# Patient Record
Sex: Female | Born: 1965 | Race: White | Hispanic: No | Marital: Married | Smoking: Former smoker
Health system: Southern US, Community
[De-identification: ages and names within clinical notes are randomized; demographics above are authoritative.]

## PROBLEM LIST (undated history)

## (undated) DIAGNOSIS — E559 Vitamin D deficiency, unspecified: Secondary | ICD-10-CM

## (undated) HISTORY — PX: SPINE SURGERY: SHX786

## (undated) HISTORY — DX: Vitamin D deficiency, unspecified: E55.9

## (undated) HISTORY — PX: KNEE ARTHROSCOPY: SUR90

## (undated) HISTORY — PX: ABDOMINAL HYSTERECTOMY: SHX81

---

## 1998-05-28 ENCOUNTER — Other Ambulatory Visit: Admission: RE | Admit: 1998-05-28 | Discharge: 1998-05-28 | Payer: Self-pay | Admitting: *Deleted

## 1998-10-21 ENCOUNTER — Ambulatory Visit (HOSPITAL_COMMUNITY): Admission: RE | Admit: 1998-10-21 | Discharge: 1998-10-21 | Payer: Self-pay | Admitting: *Deleted

## 1999-04-23 ENCOUNTER — Emergency Department (HOSPITAL_COMMUNITY): Admission: EM | Admit: 1999-04-23 | Discharge: 1999-04-23 | Payer: Self-pay | Admitting: Emergency Medicine

## 1999-04-23 ENCOUNTER — Encounter: Payer: Self-pay | Admitting: Emergency Medicine

## 1999-06-08 ENCOUNTER — Other Ambulatory Visit: Admission: RE | Admit: 1999-06-08 | Discharge: 1999-06-08 | Payer: Self-pay | Admitting: *Deleted

## 2000-07-13 ENCOUNTER — Other Ambulatory Visit: Admission: RE | Admit: 2000-07-13 | Discharge: 2000-07-13 | Payer: Self-pay | Admitting: *Deleted

## 2001-07-25 ENCOUNTER — Other Ambulatory Visit: Admission: RE | Admit: 2001-07-25 | Discharge: 2001-07-25 | Payer: Self-pay | Admitting: *Deleted

## 2002-07-29 ENCOUNTER — Other Ambulatory Visit: Admission: RE | Admit: 2002-07-29 | Discharge: 2002-07-29 | Payer: Self-pay | Admitting: *Deleted

## 2003-09-03 ENCOUNTER — Other Ambulatory Visit: Admission: RE | Admit: 2003-09-03 | Discharge: 2003-09-03 | Payer: Self-pay | Admitting: *Deleted

## 2003-11-18 ENCOUNTER — Encounter: Admission: RE | Admit: 2003-11-18 | Discharge: 2003-11-18 | Payer: Self-pay | Admitting: Specialist

## 2003-11-20 ENCOUNTER — Encounter: Admission: RE | Admit: 2003-11-20 | Discharge: 2003-11-20 | Payer: Self-pay | Admitting: Specialist

## 2003-12-23 ENCOUNTER — Observation Stay (HOSPITAL_COMMUNITY): Admission: RE | Admit: 2003-12-23 | Discharge: 2003-12-24 | Payer: Self-pay | Admitting: Specialist

## 2003-12-23 ENCOUNTER — Encounter (INDEPENDENT_AMBULATORY_CARE_PROVIDER_SITE_OTHER): Payer: Self-pay | Admitting: Specialist

## 2004-09-26 ENCOUNTER — Other Ambulatory Visit: Admission: RE | Admit: 2004-09-26 | Discharge: 2004-09-26 | Payer: Self-pay | Admitting: Obstetrics and Gynecology

## 2005-02-01 ENCOUNTER — Encounter: Admission: RE | Admit: 2005-02-01 | Discharge: 2005-02-01 | Payer: Self-pay | Admitting: Specialist

## 2006-01-31 ENCOUNTER — Encounter: Admission: RE | Admit: 2006-01-31 | Discharge: 2006-01-31 | Payer: Self-pay | Admitting: Specialist

## 2006-04-04 IMAGING — CT CT L SPINE W/ CM
3 of 8 series · 12 of 33 positions shown, 14 images · IV contrast (omnipaque)
Comparison: none

CLINICAL DATA: Low back pain radiating to the left leg.  MRI shows an abnormal disk at L5-S1.
 LUMBAR MYELOGRAM
 A lumbar puncture was performed using a 22 gauge spinal needle from a left sided approach at L4-5.  12 cc of Omnipaque 180 were instilled.

[Series 4: recon 3: l-spine helical · axial · 0.27mm/px · z∈[-28,+51]mm · 4 of 105 slices shown, 5 images]
[im 21/105  soft-tissue]
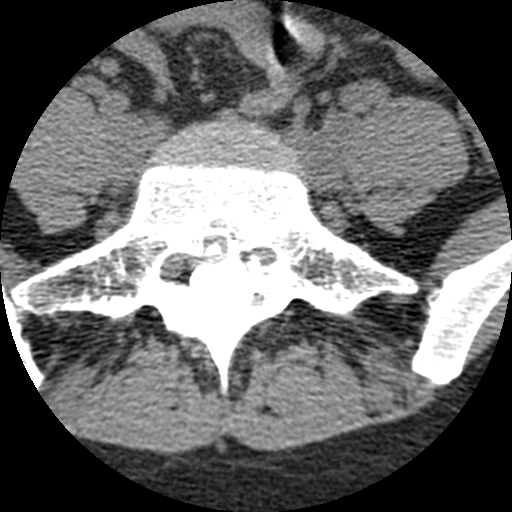
[im 21/105  bone]
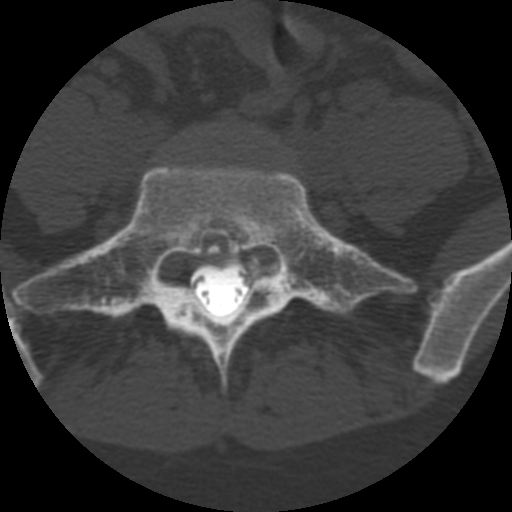
[im 42/105  bone]
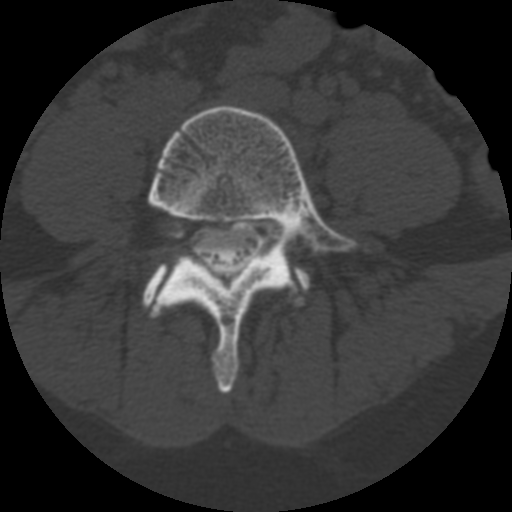
[im 63/105  bone]
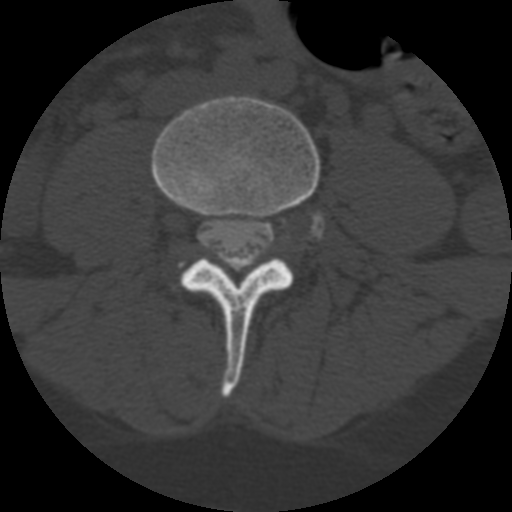
[im 84/105  bone]
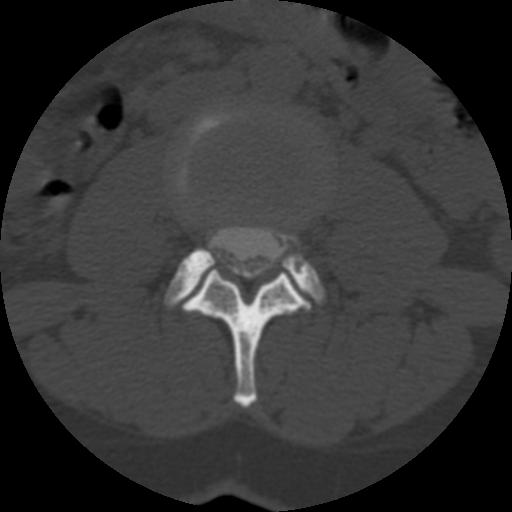

[Series 400: reformatted · sagittal · 0.27mm/px · 3 of 40 slices shown (1 of 2)]
[im 8/40  bone]
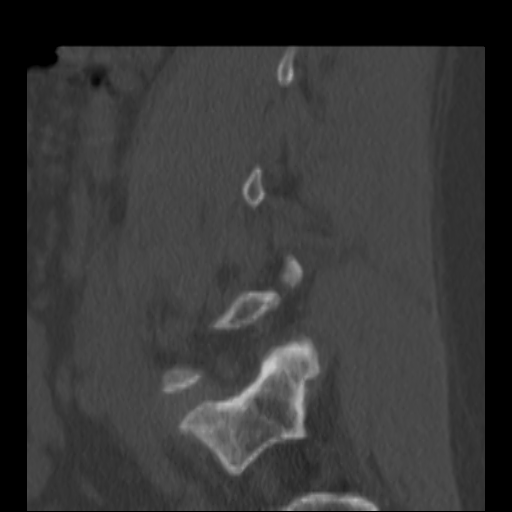
[im 16/40  bone]
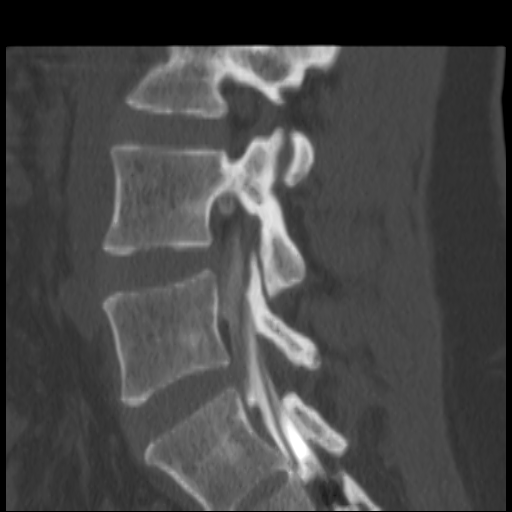
[im 24/40  bone]
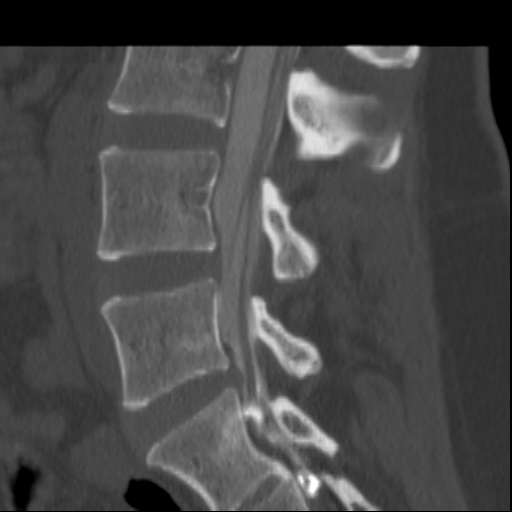

[Series 401: reformatted · coronal · 0.27mm/px · 5 of 40 slices shown, 6 images (2 of 2)]
[im 14/40  bone]
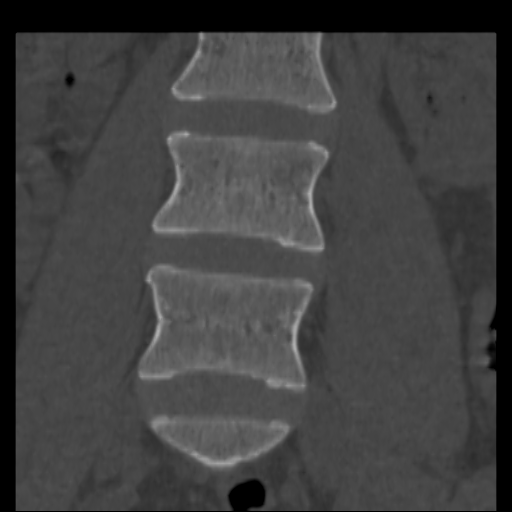
[im 17/40  bone]
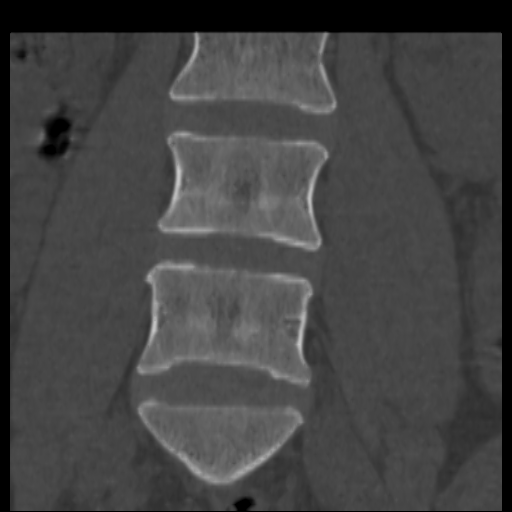
[im 20/40  soft-tissue]
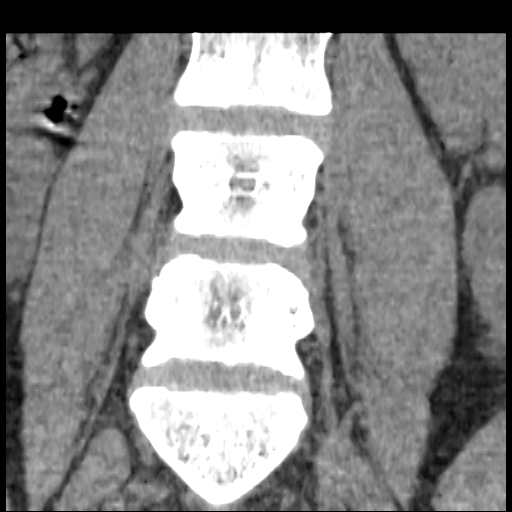
[im 20/40  bone]
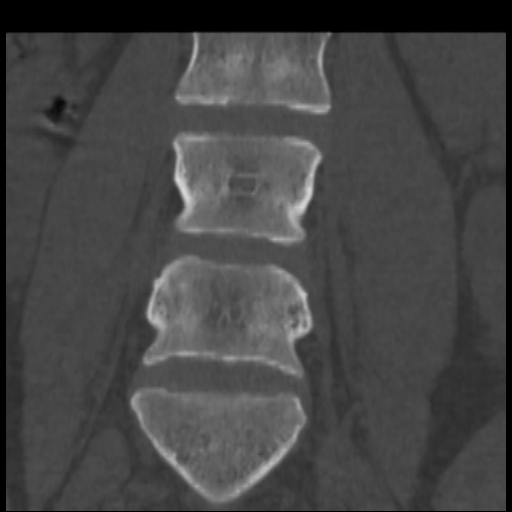
[im 23/40  bone]
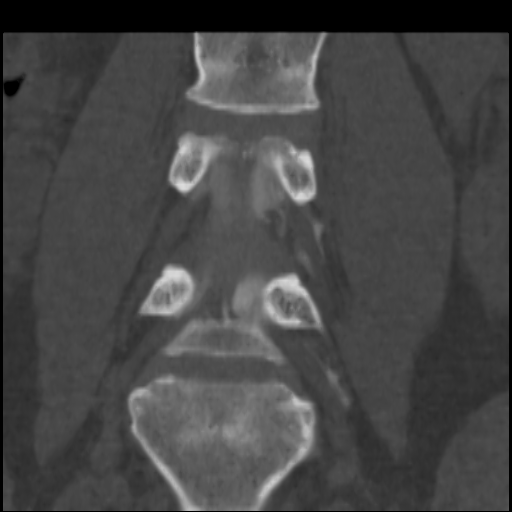
[im 27/40  bone]
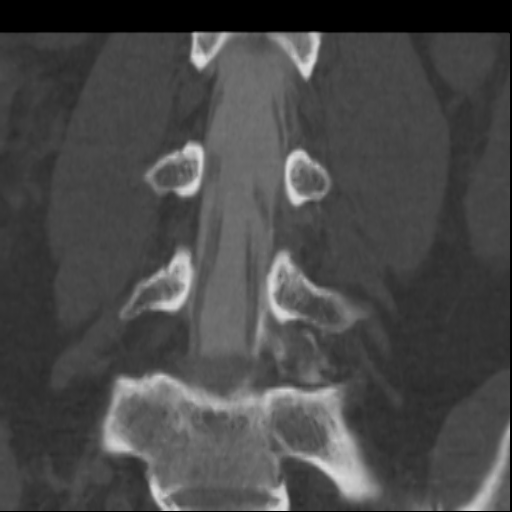

[12 of 33 positions shown; findings below may reference images not displayed]

The study shows a small anterior extradural defect at L5-S1.  There is no demonstrable compression of any root sleeve.  With standing flexion and extension, the anterior extradural defect becomes less apparent with flexion and more apparent with extension.  Right and left lateral bending does not show any convincing compressive pathology.  It is possible there is some thinning of the contrast in the left lateral recess.  
 IMPRESSION
 Small anterior extradural defect at L5-S1 more prominent in the neutral and extension positions than in the flexion position.  No definite compressive stenosis.  I question whether there could be mild thinning of the contrast column in the left lateral recess with standing and bending films.

 POST MYELOGRAM CT SCAN OF THE LUMBAR SPINE 
 Spiral scanning was performed from L3 to S1.  

 L3-4:  Normal interspace. 

 L4-5:  Minimal disk bulge, but no herniation, stenosis or foraminal compromise. 

 L5-S1:  Shallow broad-based disk protrusion perhaps minimally more prominent towards the left.  Slight caudal down-turning of disk material behind the superior endplate of S1.  No apparent compressive affect upon the thecal sac.  The disk abuts the S1 nerve root as it buds from thecal sac and it is conceivable that this nerve could be irritated.   Certainly it is not grossly compressed as seen in this position. 

 No evidence of pars defect or slippage. 
 IMPRESSION
 Shallow broad-based disk protrusion at L5-S1 slightly eccentrically  prominent towards the left and with slight caudal down-turning behind the superior endplate of S1.  Although the thecal sac or nerve roots are not grossly compressed, it is possible that there could be nerve root irritation, particularly with respect to the left S1 nerve root. 

 CT MULTIPLANAR REFORMATION
 Sagittal, coronal and para-axial  reformations were done which aid in depiction of the above described findings.

## 2006-05-17 ENCOUNTER — Ambulatory Visit (HOSPITAL_COMMUNITY): Admission: RE | Admit: 2006-05-17 | Discharge: 2006-05-18 | Payer: Self-pay | Admitting: Obstetrics and Gynecology

## 2006-05-17 ENCOUNTER — Encounter (INDEPENDENT_AMBULATORY_CARE_PROVIDER_SITE_OTHER): Payer: Self-pay | Admitting: Specialist

## 2007-04-01 ENCOUNTER — Encounter: Admission: RE | Admit: 2007-04-01 | Discharge: 2007-04-01 | Payer: Self-pay | Admitting: Obstetrics and Gynecology

## 2010-10-07 NOTE — Op Note (Signed)
Deanna, Barrett                  ACCOUNT NO.:  [id number]   MEDICAL RECORD NO.:  [id number]          PATIENT TYPE:  AMB   LOCATION:  SDC                           FACILITY:  [facility]   PHYSICIAN:  Dineen Kid. Rana Snare, M.D.    DATE OF BIRTH:  Jul 15, 1965   DATE OF PROCEDURE:  05/17/2006  DATE OF DISCHARGE:                               OPERATIVE REPORT   PREOPERATIVE DIAGNOSES:  1. Menometrorrhagia.  2. Abnormal uterine bleeding.  3. Pelvic pain.  4. Dyspareunia.  5. Fibroids.   POSTOPERATIVE DIAGNOSES:  1. Menometrorrhagia.  2. Abnormal uterine bleeding.  3. Pelvic pain.  4. Dyspareunia.  5. Fibroids.  6. Pelvic adhesions.   PROCEDURE:  Laparoscopically assisted vaginal hysterectomy and lysis of  adhesions.   SURGEON:  Dr. Candice Camp.   ASSISTANT:  Dr. Marcelle Overlie.   ANESTHESIA:  General endotracheal.   INDICATIONS:  Deanna Barrett is a 45 year old G2, P2 with worsening pelvic  pain, dyspareunia, abnormal uterine bleeding, normal saline infusion  ultrasound of fibroids and adenomyosis.  This has not been relieved with  conservative medical therapy. She desires definitive surgical  intervention and requests hysterectomy. She has had a tubal ligation in  the past, so childbearing is not an issue. Risks and benefits of the  procedure were discussed in length. Informed consent was obtained.   FINDINGS AT THE TIME OF SURGERY:  Boggy, slightly enlarged uterus with  several small fibroids. There was also adhesion from the right fallopian  tube to the cecum. Normal appearing liver. Normal appearing ovaries.   DESCRIPTION OF PROCEDURE:  After adequate anesthesia, the patient was  placed in the dorsal lithotomy position. She was sterilely prepped and  draped. Bladder sterilely drained. Grave's speculum was placed,  tenaculum was placed on the cervix. A 1-cm infraumbilical skin incision  was then made. Veress needle was inserted.  The abdomen was insufflated  with dullness to  percussion. A 11-mm trocar was inserted. The above  findings were noted by laparoscope. A 5-mm trocar was inserted left of  the midline, two fingerbreadths above the pubic symphysis under direct  visualization.  A gyrus grasping forceps was used to grasp the adhesion  from the appendix to the fallopian tube was coagulated and dissected  free. The left utero-ovarian ligament was identified, ligated and  dissected down across the utero-ovarian ligament, fallopian tube,  mesosalpinx, down across the round ligament. Good hemostasis achieved.  On reexamination, the left tube and ovarian appeared to have good  hemostasis. Care was taken to avoid the bowel and the underlying ureter.  The right utero-ovarian ligament was identified, ligated and dissected  free from the uterus, down across the mesosalpinx and the round ligament  with care taken to avoid the underlying bowel and ureter. Good  hemostasis was achieved. Bladder flap was elevated. Small flap was  created underneath the bladder at the utero-vesical junction. The  abdomen was then desufflated, the legs repositioned.  A weighted  speculum was placed in the vagina. Posterior colpotomy was performed.  The cervix was circumscribed with Bovie cautery. A LigaSure instrument  was used to ligate across the uterosacral ligaments bilaterally,  dissected with Mayo scissors. The cardinal ligaments were ligated and  dissected. Bladder pillars were dissected in a similar fashion. The  anterior vaginal mucosa was dissected off of the anterior portion of the  cervix. The anterior peritoneum was entered sharply. A Deaver retractor  was placed underneath the bladder. The inferior portions of the broad  ligament were then ligated with the LigaSure, dissected with Mayo  scissors. The uterus was removed. Uterosacral ligaments were identified.  Suture of 0 Monocryl was used to ligate each ligament, plicated in the  midline. The vagina was then closed using  interrupted figure-of-eights  of 0 Monocryl suture with good approximation, good hemostasis. The  Deaver retractor was removed. The vagina appeared to hemostatic. A Foley  catheter was then placed with good return of clear yellow urine. The  legs were repositioned. The abdomen reinsufflated. A Nazot suction  irrigator was used to irrigate the pelvis. After a copious amount of  irrigation, small peritoneal bleeders were coagulated with bipolar  cautery. Careful examination of both ovaries, the pedicles, and the  vaginal cuff appeared to have good hemostasis. Abdomen was desufflated,  and reexamination once deflated also showed good hemostasis. The trocar  was then removed. Infraumbilical skin incision was closed with a 0  Vicryl interrupted suture in the fascia, 3-0 Vicryl Rapide subcuticular  suture in the skin, 5-mm site was closed with 3-0 Vicryl Rapide  subcuticular suture. Good approximation, good hemostasis. The patient  tolerated the procedure well, was stable and transferred to the recovery  room. Sponge and instrument count was normal x3. Estimated blood loss  was 200 mL. The patient received 1 g  ____________ preoperatively.      Dineen Kid Rana Snare, M.D.  Electronically Signed     DCL/MEDQ  D:  05/17/2006  T:  05/17/2006  Job:  [id number]

## 2010-10-07 NOTE — Op Note (Signed)
NAME:  Deanna Barrett, Deanna Barrett                            ACCOUNT NO.:  [id number]   MEDICAL RECORD NO.:  [id number]                   PATIENT TYPE:  AMB   LOCATION:  DAY                                  FACILITY:  [facility]   PHYSICIAN:  Jene Every, M.D.                 DATE OF BIRTH:  1965-08-08   DATE OF PROCEDURE:  12/23/2003  DATE OF DISCHARGE:                                 OPERATIVE REPORT   PREOPERATIVE DIAGNOSIS:  Herniated nucleus pulposus, spinal stenosis, L5-S1,  left.   POSTOPERATIVE DIAGNOSIS:  Herniated nucleus pulposus, spinal stenosis, L5-  S1, left.   PROCEDURES PERFORMED:  1. Lateral recess decompression, L5-S1.  2. Foraminotomy, L5-S1.  3. Microdiskectomy, L5-S1.   ANESTHESIA:  General.   ASSISTANT:  Roma Schanz, P.A.   BRIEF HISTORY/INDICATIONS:  This is a 45 year old with S1 radicular pain.  Refractory to conservative treatment.  MRI indicating small disk protrusion  in L5-S1 paracentral to the left.  She also had a concomitant facet  arthropathy and lateral recess stenosis, combining to produce a refractory  S1 radiculopathy, and had been refractory to conservative treatment,  including rest, anti-inflammatories, activity, modification, selected nerve  root block.  Had a positive EMG and nerve conduction study.   Risks and benefits of the decompression were discussed, including bleeding,  infection, damage to vascular structures, CSF leakage, epidural fibrosis,  adjacent segment disease, etc.   After placing the patient in the supine position after successful induction  of  general anesthesia and 1 gm of Kefzol, she was placed prone on the  Bancroft frame.  All bleeding points were well padded.  The lumbar region was  prepped and draped in the usual sterile fashion.  An 18 gauge spinal needle  was utilized to localize the L5-S1 interspace and confirmed with x-ray.  An  incision was made from the spinous process from L5-S1.  Subcutaneous tissue  was  dissected and electrocautery utilized to achieve hemostasis.  The dorsal  lumbar fascia was identified and divided in line with the skin incision.  The paraspinous muscle was elevated from the lamina of L5 and S1.  A  __________ retractor was placed.  The operative microscope was draped and  brought into the surgical field.  A Penfield 4 was placed into the  intralaminar space, confirming the space with x-ray.  A small intralaminar  space was noted.  The ligamentum flavum detached from the cephalad edge of  S1, utilizing a curette.  The hemilaminotomy was performed of S1, and a  foraminotomy of S1 was performed.  There was a hypertrophic facet noted  here.  After the foraminotomy was performed, the S1 nerve root was  identified and gently mobilized medially, after the ligamentum flavum was  removed from the interspace, and the hemilaminectomy was performed caudad  edge of L5.  This was a multifactorial lateral recess stenosis secondary to  ligamentum flavum  hypertrophy and a small focal disk herniation.  With the  S1 nerve root gently protected, we did compress the lateral recess with a 2  mm Kerrison to the medial border of the pedicle.  Next, I performed a hand  annulotomy in the disk and herniated disk material was removed with a  pituitary rongeur, straight and micro.  A full diskectomy of herniated  material was then performed.  There was no residual disk herniation noted.  Inspection following that revealed no evidence of CSF leak, active bleeding.  Hockeystick probe placed into the foramen at L5 ands S1 and found to be  widely patent.  The nerve root had 1 cm of excursion medial to the pedicle  without difficulty.  The disk space was copiously irrigated.   Next, I place 1 cc of fentanyl in the epidural space, removed the McCullough  retractor.  Paraspinous muscles were inspected.  No evidence of active  bleeding.  The dorsal lumbar fascia was reapproximated with a #1 Vicryl with   interrupted figure-of-eight sutures.  The subcutaneous tissue was  reapproximated with 2-0 Vicryl simple sutures.  The skin was reapproximated  with 4-0 subcuticular PDS.  Sterile dressing was applied.  The patient was  placed supine on the hospital bed and extubated without difficulty.  Transported to the recovery room in satisfactory condition.   Patient tolerated the procedure well.  No complications.                                               Jene Every, M.D.    Cordelia Pen  D:  12/23/2003  T:  12/23/2003  Job:  [id number]

## 2010-10-07 NOTE — Discharge Summary (Signed)
NAMEMALIHA, Deanna Barrett                  ACCOUNT NO.:  [id number]   MEDICAL RECORD NO.:  [id number]          PATIENT TYPE:  OIB   LOCATION:  9320                          FACILITY:  [facility]   PHYSICIAN:  Dineen Kid. Rana Snare, M.D.    DATE OF BIRTH:  1965/11/28   DATE OF ADMISSION:  05/17/2006  DATE OF DISCHARGE:  05/18/2006                               DISCHARGE SUMMARY   HISTORY OF PRESENT ILLNESS:  [name] is 45 year old G2 P2 with ongoing  problems of menorrhagia, menometrorrhagia, dyspareunia, and pelvic pain.  Normal setting in an __________ ultrasound except for the appearance of  an adenomyosis and fibroids.  She has failed conservative medical  management; and at this time desires definitive surgical intervention  and presents for laparoscopically assisted vaginal hysterectomy.   HOSPITAL COURSE:  The patient underwent a laparoscopically assisted  vaginal hysterectomy with lysis of adhesions.  The surgery was  uncomplicated with an estimated blood loss of 200 mL.  Her postoperative  care was unremarkable.  She had good return of bowel function and was  able to ambulate, tolerated a regular diet on postop day #1 with a  hemoglobin of 10.4 postoperatively.  Incisions were clean, dry, and  intact with normoactive bowel sounds.  On postop day #1 she was  discharged home to followup in the office in 1-2 weeks.  She was sent  home with a prescription for Tylox #30; told to return for increased  pain, fever, or bleeding.   DISCHARGE CONDITION:  Good.      Dineen Kid Rana Snare, M.D.  Electronically Signed     DCL/MEDQ  D:  05/18/2006  T:  05/18/2006  Job:  [id number]

## 2010-10-07 NOTE — H&P (Signed)
Deanna Barrett, Deanna Barrett                  ACCOUNT NO.:  [id number]   MEDICAL RECORD NO.:  [id number]          PATIENT TYPE:  AMB   LOCATION:  SDC                           FACILITY:  [facility]   PHYSICIAN:  Dineen Kid. Rana Snare, M.D.    DATE OF BIRTH:  August 10, 1965   DATE OF ADMISSION:  DATE OF DISCHARGE:                              HISTORY & PHYSICAL   HISTORY OF PRESENT ILLNESS:  [name] is a 45 year old G2, P2 who has  been having ongoing problems with menorrhagia, metrorrhagia,  dyspareunia, pelvic pain.  She underwent a saline infusion ultrasound  which was consistent with adenomyosis.  No intracavitary masses were  seen.  She has tried conservative medical therapy, which has included  anti-inflammatory medications as well as narcotic medication.  She has  had a tubal ligation in the past and desires definitive surgical  intervention and requests a hysterectomy and presents for a laparoscopic-  assisted vaginal hysterectomy today.   Her past medical history is significant for lower back disease.  Currently, Vicodin, Robaxin for this.   PAST SURGICAL HISTORY:  She has had a microdiskectomy at the L4-S1.   SOCIAL HISTORY:  Her father recently was diagnosed with lung cancer.  She does have two children. The largest baby was 8 pounds, 2 ounces.  Last menstrual period was April 24, 2006.   MEDICATIONS:  Vicodin and Robaxin.   She has no known drug allergies.   PHYSICAL EXAMINATION:  VITAL SIGNS:  Blood pressure 124/70.  Weight is  146.  Height 5 feet, 5 inches.  HEART:  Regular rate and rhythm.  LUNGS:  Clear to auscultation bilaterally.  ABDOMEN:  Nondistended, nontender.  There is no rebound or guarding.  PELVIC:  The uterus is anteverted, mobile.  Nontender.  No adnexal  masses are palpable.   By ultrasound, the uterus measures 8.4 x 4.4 x 5.2 cm in size.   IMPRESSION/PLAN:  Menorrhagia, dyspareunia, pelvic pain.  She has failed  conservative medical management.  She desires definitive  surgical  intervention.  Requests hysterectomy and laparoscopic-assisted vaginal  hysterectomy.  The risks and benefits of the procedure were discussed at  length, which included but are not limited to the risk of infection,  bleeding, damage to the bowel, bladder, ureters, tubes, ovaries, risks  associated with anesthesia, blood transfusion.  She does give her  informed consent and wishes to proceed.      Dineen Kid Rana Snare, M.D.  Electronically Signed    DCL/MEDQ  D:  05/16/2006  T:  05/16/2006  Job:  [id number]

## 2011-10-24 DIAGNOSIS — Z8371 Family history of colonic polyps: Secondary | ICD-10-CM | POA: Insufficient documentation

## 2014-01-27 ENCOUNTER — Other Ambulatory Visit: Payer: Self-pay | Admitting: Obstetrics and Gynecology

## 2014-08-27 ENCOUNTER — Other Ambulatory Visit: Payer: Self-pay | Admitting: Obstetrics and Gynecology

## 2014-08-28 LAB — CYTOLOGY - PAP

## 2016-11-28 ENCOUNTER — Encounter: Payer: Self-pay | Admitting: Family Medicine

## 2016-11-28 ENCOUNTER — Ambulatory Visit (INDEPENDENT_AMBULATORY_CARE_PROVIDER_SITE_OTHER): Payer: 59 | Admitting: Family Medicine

## 2016-11-28 VITALS — BP 122/83 | HR 73 | Ht 66.0 in | Wt 156.4 lb

## 2016-11-28 DIAGNOSIS — H6123 Impacted cerumen, bilateral: Secondary | ICD-10-CM | POA: Diagnosis not present

## 2016-11-28 DIAGNOSIS — R51 Headache: Secondary | ICD-10-CM

## 2016-11-28 DIAGNOSIS — Z9889 Other specified postprocedural states: Secondary | ICD-10-CM

## 2016-11-28 DIAGNOSIS — G478 Other sleep disorders: Secondary | ICD-10-CM

## 2016-11-28 DIAGNOSIS — Z808 Family history of malignant neoplasm of other organs or systems: Secondary | ICD-10-CM

## 2016-11-28 DIAGNOSIS — R519 Headache, unspecified: Secondary | ICD-10-CM

## 2016-11-28 DIAGNOSIS — H9311 Tinnitus, right ear: Secondary | ICD-10-CM

## 2016-11-28 DIAGNOSIS — Z833 Family history of diabetes mellitus: Secondary | ICD-10-CM

## 2016-11-28 MED ORDER — FLUTICASONE PROPIONATE 50 MCG/ACT NA SUSP: 1.0000 | Freq: Two times a day (BID) | NASAL | 6 refills | Status: DC

## 2016-11-28 NOTE — Patient Instructions (Signed)
Try to use [place]yr or Lloyd Huger med sinus rinses twice daily.  It would be great if afterward she can do 1 spray of Flonase in each nostril twice daily after each rinse.  - If you would like you can take a Claritin or Allegra or a Zyrtec pill over-the-counter daily as well.  This would help haste in the symptoms and recovery    Please realize, EXERCISE IS MEDICINE!  -  American Heart Association [facility]) guidelines for exercise : If you are in good health, without any medical conditions, you should engage in 150 minutes of moderate intensity aerobic activity per week.  This means you should be huffing and puffing throughout your workout.   Engaging in regular exercise will improve brain function and memory, as well as improve mood, boost immune system and help with weight management.  As well as the other, more well-known effects of exercise such as decreasing blood sugar levels, decreasing blood pressure,  and decreasing bad cholesterol levels/ increasing good cholesterol levels.     -  The AHA strongly endorses consumption of a diet that contains a variety of foods from all the food categories with an emphasis on fruits and vegetables; fat-free and low-fat dairy products; cereal and grain products; legumes and nuts; and fish, poultry, and/or extra lean meats.    Excessive food intake, especially of foods high in saturated and trans fats, sugar, and salt, should be avoided.    Adequate water intake of roughly 1/2 of your weight in pounds, should equal the ounces of water per day you should drink.  So for instance, if you're 200 pounds, that would be 100 ounces of water per day.         Mediterranean Diet  Why follow it? Research shows. . Those who follow the Mediterranean diet have a reduced risk of heart disease  . The diet is associated with a reduced incidence of Parkinson's and Alzheimer's diseases . People following the diet may have longer life expectancies and lower rates of chronic diseases   . The Dietary Guidelines for Americans recommends the Mediterranean diet as an eating plan to promote health and prevent disease  What Is the Mediterranean Diet?  . Healthy eating plan based on typical foods and recipes of Mediterranean-style cooking . The diet is primarily a plant based diet; these foods should make up a majority of meals   Starches - Plant based foods should make up a majority of meals - They are an important sources of vitamins, minerals, energy, antioxidants, and fiber - Choose whole grains, foods high in fiber and minimally processed items  - Typical grain sources include wheat, oats, barley, corn, brown rice, bulgar, farro, millet, polenta, couscous  - Various types of beans include chickpeas, lentils, fava beans, black beans, white beans   Fruits  Veggies - Large quantities of antioxidant rich fruits & veggies; 6 or more servings  - Vegetables can be eaten raw or lightly drizzled with oil and cooked  - Vegetables common to the traditional Mediterranean Diet include: artichokes, arugula, beets, broccoli, brussel sprouts, cabbage, carrots, celery, collard greens, cucumbers, eggplant, kale, leeks, lemons, lettuce, mushrooms, okra, onions, peas, peppers, potatoes, pumpkin, radishes, rutabaga, shallots, spinach, sweet potatoes, turnips, zucchini - Fruits common to the Mediterranean Diet include: apples, apricots, avocados, cherries, clementines, dates, figs, grapefruits, grapes, melons, nectarines, oranges, peaches, pears, pomegranates, strawberries, tangerines  Fats - Replace butter and margarine with healthy oils, such as olive oil, canola oil, and tahini  - Limit  nuts to no more than a handful a day  - Nuts include walnuts, almonds, pecans, pistachios, pine nuts  - Limit or avoid candied, honey roasted or heavily salted nuts - Olives are central to the Mediterranean diet - can be eaten whole or used in a variety of dishes   Meats Protein - Limiting red meat: no more than a  few times a month - When eating red meat: choose lean cuts and keep the portion to the size of deck of cards - Eggs: approx. 0 to 4 times a week  - Fish and lean poultry: at least 2 a week  - Healthy protein sources include, chicken, [place], lean beef, lamb - Increase intake of seafood such as tuna, salmon, trout, mackerel, shrimp, scallops - Avoid or limit high fat processed meats such as sausage and bacon  Dairy - Include moderate amounts of low fat dairy products  - Focus on healthy dairy such as fat free yogurt, skim milk, low or reduced fat cheese - Limit dairy products higher in fat such as whole or 2% milk, cheese, ice cream  Alcohol - Moderate amounts of red wine is ok  - No more than 5 oz daily for women (all ages) and men older than age 49  - No more than 10 oz of wine daily for men younger than 85  Other - Limit sweets and other desserts  - Use herbs and spices instead of salt to flavor foods  - Herbs and spices common to the traditional Mediterranean Diet include: basil, bay leaves, chives, cloves, cumin, fennel, garlic, lavender, marjoram, mint, oregano, parsley, pepper, rosemary, sage, savory, sumac, tarragon, thyme   It's not just a diet, it's a lifestyle:  . The Mediterranean diet includes lifestyle factors typical of those in the region  . Foods, drinks and meals are best eaten with others and savored . Daily physical activity is important for overall good health . This could be strenuous exercise like running and aerobics . This could also be more leisurely activities such as walking, housework, yard-work, or taking the stairs . Moderation is the key; a balanced and healthy diet accommodates most foods and drinks . Consider portion sizes and frequency of consumption of certain foods   Meal Ideas & Options:  . Breakfast:  o Whole wheat toast or whole wheat English muffins with peanut butter & hard boiled egg o Steel cut oats topped with apples & cinnamon and skim milk   o Fresh fruit: banana, strawberries, melon, berries, peaches  o Smoothies: strawberries, bananas, greek yogurt, peanut butter o Low fat greek yogurt with blueberries and granola  o Egg white omelet with spinach and mushrooms o Breakfast couscous: whole wheat couscous, apricots, skim milk, cranberries  . Sandwiches:  o Hummus and grilled vegetables (peppers, zucchini, squash) on whole wheat bread   o Grilled chicken on whole wheat pita with lettuce, tomatoes, cucumbers or tzatziki  o Tuna salad on whole wheat bread: tuna salad made with greek yogurt, olives, red peppers, capers, green onions o Garlic rosemary lamb pita: lamb sauted with garlic, rosemary, salt & pepper; add lettuce, cucumber, greek yogurt to pita - flavor with lemon juice and black pepper  . Seafood:  o Mediterranean grilled salmon, seasoned with garlic, basil, parsley, lemon juice and black pepper o Shrimp, lemon, and spinach whole-grain pasta salad made with low fat greek yogurt  o Seared scallops with lemon orzo  o Seared tuna steaks seasoned salt, pepper, coriander topped with tomato mixture  of olives, tomatoes, olive oil, minced garlic, parsley, green onions and cappers  . Meats:  o Herbed greek chicken salad with kalamata olives, cucumber, feta  o Red bell peppers stuffed with spinach, bulgur, lean ground beef (or lentils) & topped with feta   o Kebabs: skewers of chicken, tomatoes, onions, zucchini, squash  o [place] burgers: made with red onions, mint, dill, lemon juice, feta cheese topped with roasted red peppers . Vegetarian o Cucumber salad: cucumbers, artichoke hearts, celery, red onion, feta cheese, tossed in olive oil & lemon juice  o Hummus and whole grain pita points with a greek salad (lettuce, tomato, feta, olives, cucumbers, red onion) o Lentil soup with celery, carrots made with vegetable broth, garlic, salt and pepper  o Tabouli salad: parsley, bulgur, mint, scallions, cucumbers, tomato, radishes, lemon  juice, olive oil, salt and pepper.

## 2016-11-28 NOTE — Progress Notes (Signed)
New patient office visit note:  Impression and Recommendations:    1. History of back surgery, with L radicular pain   2. Family history of melanoma- sister around age 51      No problem-specific Assessment & Plan notes found for this encounter.   The patient was counseled, risk factors were discussed, anticipatory guidance given.   New Prescriptions   No medications on file     Discontinued Medications   No medications on file      No orders of the defined types were placed in this encounter.    Gross side effects, risk and benefits, and alternatives of medications discussed with patient.  Patient is aware that all medications have potential side effects and we are unable to predict every side effect or drug-drug interaction that may occur.  Expresses verbal understanding and consents to current therapy plan and treatment regimen.  No Follow-up on file.  Please see AVS handed out to patient at the end of our visit for further patient instructions/ counseling done pertaining to today's office visit.    Note: This document was prepared using Dragon voice recognition software and may include unintentional dictation errors.  ----------------------------------------------------------------------------------------------------------------------    Subjective:    Chief complaint:   Chief Complaint  Patient presents with  . Establish Care     HPI: Deanna Barrett is a pleasant 51 y.o. female who presents to [facility] today to review their medical history with me and establish care.   I asked the patient to review their chronic problem list with me to ensure everything was updated and accurate.    All recent office visits with other providers, any medical records that patient brought in etc  - I reviewed today.     Also asked pt to get me medical records from [facility] providers/ specialists that they had seen within the past 3-5 years- if  they are in private practice and/or do not work for a [employer], [facility], [facility], [facility] or [employer] owned practice.  Told them to call their specialists to clarify this if they are not sure.   - pcp- prior Martha Clan  Headache- about a month or so.  B/L  Periocular/  b/l  bifrontal   1-2/ 10 at worst.   advil--> makes it go away.   Doesn't bother her too much.  Occasionally has running nose especially when exercising or walking, but do not seasonal allergies.  She denies neck pain.  She did have some visual abnormalities and changes in which [facility] approximately 1-1-1/2 months ago.  Headache started just prior to that which prompted her to get her eyes checked   2 yrs non- stop ringing in R ear. Notices it during complete silence.  Never really stops.  Went  ENT 2 yrs ago or so-- told cerumen notother issues.   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  Problem  History of back surgery, with L radicular pain  Family history of melanoma- sister around age 28      Wt Readings from Last 3 Encounters:  11/28/16 156 lb 6.4 oz (70.9 kg)   BP Readings from Last 3 Encounters:  11/28/16 122/83   Pulse Readings from Last 3 Encounters:  11/28/16 73   BMI Readings from Last 3 Encounters:  11/28/16 25.24 kg/m    Patient Care Team    Relationship Specialty Notifications Start End  Thomasene Lotpalski, Kathlynn Swofford, DO PCP - General Family Medicine  11/28/16   Nita SellsHall, John, MD Consulting Physician Dermatology  11/28/16    Comment: [facility] skin screening  Candice CampLowe, David, MD Consulting Physician Obstetrics and Gynecology  11/28/16     Patient Active Problem List   Diagnosis Date Noted  . History of back surgery, with L radicular pain 11/28/2016  . Family history of melanoma- sister around age 51 11/28/2016     History reviewed. No pertinent past medical history.   History reviewed. No pertinent past medical history.   Past Surgical History:    Procedure Laterality Date  . ABDOMINAL HYSTERECTOMY     partial  . KNEE ARTHROSCOPY    . SPINE SURGERY       Family History  Problem Relation Age of Onset  . Cancer Father        lung  . Hypertension Father      History  Drug Use No     History  Alcohol Use  . Yes    Comment: occ.     History  Smoking Status  . Former Smoker  . Packs/day: 0.50  . Years: 25.00  . Quit date: 05/14/2013  Smokeless Tobacco  . Former User     Outpatient Encounter Prescriptions as of 11/28/2016  Medication Sig  . Doxylamine Succinate, Sleep, (SLEEP AID PO) Take 1 tablet by mouth at bedtime.  Marland Kitchen. ibuprofen (ADVIL,MOTRIN) 100 MG/5ML suspension Take 200 mg by mouth every 4 (four) hours as needed.   No facility-administered encounter medications on file as of 11/28/2016.     Allergies: Patient has no known allergies.   Review of Systems  Constitutional: Negative for chills, diaphoresis, fever, malaise/fatigue and weight loss.  HENT: Positive for tinnitus. Negative for congestion and sore throat.        Right ear 2 years, seen ENT  Eyes: Negative for blurred vision, double vision and photophobia.  Respiratory: Negative for cough and wheezing.   Cardiovascular: Negative for chest pain and palpitations.  Gastrointestinal: Negative for blood in stool, diarrhea, nausea and vomiting.  Genitourinary: Negative for dysuria, frequency and urgency.  Musculoskeletal: Negative for joint pain and myalgias.  Skin: Negative for itching and rash.  Neurological: Positive for headaches. Negative for dizziness, focal weakness and weakness.       2 months; Periocular/ bifrontal  Endo/Heme/Allergies: Negative for environmental allergies and polydipsia. Bruises/bleeds easily.  Psychiatric/Behavioral: Negative for depression and memory loss. The patient is not nervous/anxious and does not have insomnia.      Objective:   Blood pressure 122/83, pulse 73, height 5\' 6"  (1.676 m), weight 156 lb 6.4 oz  (70.9 kg). Body mass index is 25.24 kg/m. General: Well Developed, well nourished, and in no acute distress.  Neuro: Alert and oriented x3, extra-ocular muscles intact, sensation grossly intact.  HEENT:[place]/AT, PERRLA, neck supple, No carotid bruits Skin: no gross rashes  Cardiac: Regular rate and rhythm Respiratory: Essentially clear to auscultation bilaterally. Not using accessory muscles, speaking in full sentences.  Abdominal: not grossly distended Musculoskeletal: Ambulates w/o diff, FROM * 4 ext.  Vasc: less 2 sec cap RF, warm and pink  Psych:  No HI/SI, judgement and insight good, Euthymic mood. Full Affect.    No results found for this or any previous visit (from the past 2160 hour(s)).

## 2016-11-29 ENCOUNTER — Other Ambulatory Visit: Payer: Self-pay

## 2016-11-29 DIAGNOSIS — R51 Headache: Secondary | ICD-10-CM

## 2016-11-29 DIAGNOSIS — H9311 Tinnitus, right ear: Secondary | ICD-10-CM

## 2016-11-29 DIAGNOSIS — H6123 Impacted cerumen, bilateral: Secondary | ICD-10-CM

## 2016-11-29 DIAGNOSIS — R519 Headache, unspecified: Secondary | ICD-10-CM

## 2016-11-29 MED ORDER — FLUTICASONE PROPIONATE 50 MCG/ACT NA SUSP: 1.0000 | Freq: Two times a day (BID) | NASAL | 6 refills | Status: DC

## 2016-11-29 NOTE — Telephone Encounter (Signed)
Pharmacy sent refill request for Flonase - sent in to [facility].  MPulliam, CMA/RT(R)

## 2016-11-30 ENCOUNTER — Telehealth: Payer: Self-pay

## 2016-11-30 NOTE — Telephone Encounter (Signed)
Pharmacy sent over request for alternative due to Flonase not being covered.  Please advise if you would like to change medication.  MPulliam, CMA/RT(R)

## 2016-12-02 NOTE — Telephone Encounter (Signed)
She can buy the OTC flonase- that's what is best for her

## 2016-12-04 ENCOUNTER — Telehealth: Payer: Self-pay | Admitting: Family Medicine

## 2016-12-04 NOTE — Telephone Encounter (Signed)
Patient unsure why cll from [place], left no reason except for Pt to cll office--glh

## 2016-12-04 NOTE — Telephone Encounter (Signed)
Called patient left message to call the office. MPulliam, CMA/RT(R)  

## 2016-12-04 NOTE — Telephone Encounter (Signed)
Called patient left message to notify patient. MPulliam, CMA/RT(R)

## 2016-12-05 ENCOUNTER — Other Ambulatory Visit: Payer: 59

## 2016-12-05 DIAGNOSIS — Z0000 Encounter for general adult medical examination without abnormal findings: Secondary | ICD-10-CM

## 2016-12-07 LAB — CBC WITH DIFFERENTIAL/PLATELET
Basophils Absolute: 0.1 10*3/uL (ref 0.0–0.2)
Basos: 1 %
EOS (ABSOLUTE): 0.1 10*3/uL (ref 0.0–0.4)
EOS: 2 %
HEMATOCRIT: 39.1 % (ref 34.0–46.6)
Hemoglobin: 12.7 g/dL (ref 11.1–15.9)
IMMATURE GRANS (ABS): 0 10*3/uL (ref 0.0–0.1)
IMMATURE GRANULOCYTES: 0 %
LYMPHS: 34 %
Lymphocytes Absolute: 1.4 10*3/uL (ref 0.7–3.1)
MCH: 28.5 pg (ref 26.6–33.0)
MCHC: 32.5 g/dL (ref 31.5–35.7)
MCV: 88 fL (ref 79–97)
MONOCYTES: 7 %
MONOS ABS: 0.3 10*3/uL (ref 0.1–0.9)
NEUTROS PCT: 56 %
Neutrophils Absolute: 2.4 10*3/uL (ref 1.4–7.0)
Platelets: 280 10*3/uL (ref 150–379)
RBC: 4.46 x10E6/uL (ref 3.77–5.28)
RDW: 13.3 % (ref 12.3–15.4)
WBC: 4.2 10*3/uL (ref 3.4–10.8)

## 2016-12-07 LAB — HEMOGLOBIN A1C
ESTIMATED AVERAGE GLUCOSE: 100 mg/dL
Hgb A1c MFr Bld: 5.1 % (ref 4.8–5.6)

## 2016-12-07 LAB — COMPREHENSIVE METABOLIC PANEL
ALK PHOS: 49 IU/L (ref 39–117)
ALT: 9 IU/L (ref 0–32)
AST: 16 IU/L (ref 0–40)
Albumin/Globulin Ratio: 1.7 (ref 1.2–2.2)
Albumin: 4.1 g/dL (ref 3.5–5.5)
BUN/Creatinine Ratio: 14 (ref 9–23)
BUN: 12 mg/dL (ref 6–24)
Bilirubin Total: 0.4 mg/dL (ref 0.0–1.2)
CO2: 23 mmol/L (ref 20–29)
CREATININE: 0.86 mg/dL (ref 0.57–1.00)
Calcium: 9.2 mg/dL (ref 8.7–10.2)
Chloride: 107 mmol/L — ABNORMAL HIGH (ref 96–106)
GFR calc Af Amer: 91 mL/min/{1.73_m2} (ref >59)
GFR calc non Af Amer: 79 mL/min/{1.73_m2} (ref >59)
GLUCOSE: 92 mg/dL (ref 65–99)
Globulin, Total: 2.4 g/dL (ref 1.5–4.5)
Potassium: 4.6 mmol/L (ref 3.5–5.2)
SODIUM: 141 mmol/L (ref 134–144)
Total Protein: 6.5 g/dL (ref 6.0–8.5)

## 2016-12-07 LAB — LIPID PANEL
CHOLESTEROL TOTAL: 193 mg/dL (ref 100–199)
Chol/HDL Ratio: 3 ratio (ref 0.0–4.4)
HDL: 65 mg/dL (ref >39)
LDL Calculated: 107 mg/dL — ABNORMAL HIGH (ref 0–99)
Triglycerides: 106 mg/dL (ref 0–149)
VLDL CHOLESTEROL CAL: 21 mg/dL (ref 5–40)

## 2016-12-07 LAB — VITAMIN D 25 HYDROXY (VIT D DEFICIENCY, FRACTURES): VIT D 25 HYDROXY: 25.5 ng/mL — AB (ref 30.0–100.0)

## 2016-12-07 LAB — TSH: TSH: 1.29 u[IU]/mL (ref 0.450–4.500)

## 2016-12-13 ENCOUNTER — Encounter: Payer: Self-pay | Admitting: Family Medicine

## 2016-12-13 DIAGNOSIS — E7800 Pure hypercholesterolemia, unspecified: Secondary | ICD-10-CM | POA: Insufficient documentation

## 2016-12-13 DIAGNOSIS — E559 Vitamin D deficiency, unspecified: Secondary | ICD-10-CM | POA: Insufficient documentation

## 2016-12-20 ENCOUNTER — Ambulatory Visit (INDEPENDENT_AMBULATORY_CARE_PROVIDER_SITE_OTHER): Payer: 59 | Admitting: Family Medicine

## 2016-12-20 ENCOUNTER — Encounter: Payer: Self-pay | Admitting: Family Medicine

## 2016-12-20 VITALS — BP 130/80 | HR 70 | Ht 66.0 in | Wt 154.4 lb

## 2016-12-20 DIAGNOSIS — Z833 Family history of diabetes mellitus: Secondary | ICD-10-CM | POA: Diagnosis not present

## 2016-12-20 DIAGNOSIS — E7800 Pure hypercholesterolemia, unspecified: Secondary | ICD-10-CM

## 2016-12-20 DIAGNOSIS — R7989 Other specified abnormal findings of blood chemistry: Secondary | ICD-10-CM | POA: Insufficient documentation

## 2016-12-20 DIAGNOSIS — E559 Vitamin D deficiency, unspecified: Secondary | ICD-10-CM

## 2016-12-20 DIAGNOSIS — Z808 Family history of malignant neoplasm of other organs or systems: Secondary | ICD-10-CM | POA: Diagnosis not present

## 2016-12-20 MED ORDER — VITAMIN D (ERGOCALCIFEROL) 1.25 MG (50000 UNIT) PO CAPS: 50000.0000 [IU] | ORAL_CAPSULE | ORAL | 0 refills | Status: DC

## 2016-12-20 MED ORDER — VITAMIN D3 125 MCG (5000 UT) PO TABS: ORAL_TABLET | ORAL | 3 refills | Status: DC

## 2016-12-20 NOTE — Progress Notes (Signed)
Assessment and plan:  1. Vitamin D insufficiency- was 25.5 in 7/18   2. High serum HDL- ( 65 in 7/ 18)   3. Elevated LDL cholesterol level- (107 in 7/ 18   4. Family history of diabetes mellitus in father- late onset   21. Family history of melanoma- sister around age 51     Vitamin D insufficiency-  was 25.5 in 7/18 - Wkly supp * 12 wks  plus 5k IU QD indefinitely  - reck 6 mo  Elevated LDL cholesterol level-  107 in 7/18 Dietary changes such as low saturated & trans fat discussed with patient.  Encouraged regular exercise   Educational handouts provided at patient's desire.  Contact us prior with any Q's/ concerns.   High serum HDL  ( 65 in 7/18) cont daily exercise of walking at least 45 min QD at lunch and eat healthy fats  Family history of diabetes mellitus in father- late onset A1c- WNL's - reassured pt we should check yrly due to fam hx  - keep a N BMI, prudent diet and exercise daily is best prevention ag DM  Family history of melanoma- sister around age 32 Will cont with yrly Derm OV's and skin screenings  - STRONGLY encouraged sunscreen which pt has not been using as evidence by immense tan she currently has    Meds ordered this encounter  Medications  . Vitamin D, Ergocalciferol, (DRISDOL) 50000 units CAPS capsule    Sig: Take 1 capsule (50,000 Units total) by mouth every 7 (seven) days.    Dispense:  12 capsule    Refill:  0  . Cholecalciferol (VITAMIN D3) 5000 units TABS    Sig: 5,000 IU OTC vitamin D3 daily.    Dispense:  90 tablet    Refill:  3    Discontinued Medications   No medications on file    Modified Medications   No medications on file     Orders Placed This Encounter  Procedures  . VITAMIN D 25 Hydroxy (Vit-D Deficiency, Fractures)     Return in about 6 months (around 06/22/2017) for lab only-   56mo for vit D; o/w q yrly.  Anticipatory guidance and routine  counseling done re: condition, txmnt options and need for follow up. All questions of patient's were answered.   Gross side effects, risk and benefits, and alternatives of medications discussed with patient.  Patient is aware that all medications have potential side effects and we are unable to predict every sideeffect or drug-drug interaction that may occur.  Expresses verbal understanding and consents to current therapy plan and treatment regiment.  Please see AVS handed out to patient at the end of our visit for additional patient instructions/ counseling done pertaining to today's office visit.  Note: This document was prepared using Dragon voice recognition software and may include unintentional dictation errors.   ----------------------------------------------------------------------------------------------------------------------  Subjective:   CC:   Deanna Barrett is a 51 y.o. female who presents to [facility] today for review and discussion of recent bloodwork that was done.  1. All recent blood work that we ordered was reviewed with patient today.  Patient was counseled on all abnormalities and we discussed dietary and lifestyle changes that could help those values (also medications when appropriate).  Extensive health counseling performed and all patient's concerns/ questions were addressed.  2. Vit D is too low- less than 30 and pt has c/o gen fatigue  and exercise intol/ easily fatigability of leg muscles esp.  Never told vit D was low in past-- never had checked to her knowledge    Wt Readings from Last 3 Encounters:  12/20/16 154 lb 6.4 oz (70 kg)  11/28/16 156 lb 6.4 oz (70.9 kg)   BP Readings from Last 3 Encounters:  12/20/16 130/80  11/28/16 122/83   Pulse Readings from Last 3 Encounters:  12/20/16 70  11/28/16 73   BMI Readings from Last 3 Encounters:  12/20/16 24.92 kg/m  11/28/16 25.24 kg/m     Patient Care Team    Relationship  Specialty Notifications Start End  Thomasene Lotpalski, Yasha Tibbett, DO PCP - General Family Medicine  11/28/16   Nita SellsHall, John, MD Consulting Physician Dermatology  11/28/16    Comment: yrly skin screening  Candice CampLowe, David, MD Consulting Physician Obstetrics and Gynecology  11/28/16   Jene EveryBeane, Jeffrey, MD Consulting Physician Orthopedic Surgery  11/28/16    Comment: Back surgery, left leg radiculopathy knee down.  Sharrell KuMedoff, Jeffrey, MD Consulting Physician Gastroenterology  12/20/16     Full medical history updated and reviewed in the office today  Patient Active Problem List   Diagnosis Date Noted  . Vitamin D insufficiency-  was 25.5 in 7/18 12/13/2016    Priority: High  . High serum HDL  ( 65 in 7/18) 12/20/2016    Priority: Medium  . Elevated LDL cholesterol level-  107 in 7/18 12/13/2016    Priority: Medium  . History of back surgery, with L radicular pain 11/28/2016  . Family history of melanoma- sister around age 51 11/28/2016  . Family history of diabetes mellitus in father- late onset 11/28/2016  . Family history of colonic polyps 10/24/2011    Past Medical History:  Diagnosis Date  . Vitamin D deficiency     Past Surgical History:  Procedure Laterality Date  . ABDOMINAL HYSTERECTOMY     partial  . KNEE ARTHROSCOPY    . SPINE SURGERY      Social History  Substance Use Topics  . Smoking status: Former Smoker    Packs/day: 0.50    Years: 25.00    Quit date: 05/14/2013  . Smokeless tobacco: Former NeurosurgeonUser  . Alcohol use Yes     Comment: occ.    Family Hx: Family History  Problem Relation Age of Onset  . Cancer Father        lung  . Hypertension Father      Medications: Current Outpatient Prescriptions  Medication Sig Dispense Refill  . Doxylamine Succinate, Sleep, (SLEEP AID PO) Take 1 tablet by mouth at bedtime.    . fluticasone (FLONASE) 50 MCG/ACT nasal spray Place 1 spray into both nostrils 2 (two) times daily. 16 g 6  . ibuprofen (ADVIL,MOTRIN) 100 MG/5ML suspension Take 200  mg by mouth every 4 (four) hours as needed.    . Cholecalciferol (VITAMIN D3) 5000 units TABS 5,000 IU OTC vitamin D3 daily. 90 tablet 3  . Vitamin D, Ergocalciferol, (DRISDOL) 50000 units CAPS capsule Take 1 capsule (50,000 Units total) by mouth every 7 (seven) days. 12 capsule 0   No current facility-administered medications for this visit.     Allergies:  No Known Allergies   Review of Systems: General:   No F/C, wt loss Pulm:   No DIB, SOB, pleuritic chest pain Card:  No CP, palpitations Abd:  No n/v/d or pain Ext:  No inc edema from baseline  Objective:  Blood pressure 130/80, pulse 70, height 5\' 6"  (1.676  m), weight 154 lb 6.4 oz (70 kg). Body mass index is 24.92 kg/m. Gen:   Well NAD, A and O *3 HEENT:    [place]/AT, EOMI,  MMM Lungs:   Normal work of breathing. CTA B/L, no Wh, rhonchi Heart:   RRR, S1, S2 WNL's, no MRG Abd:   No gross distention Exts:    warm, pink,  Brisk capillary refill, warm and well perfused.  Psych:    No HI/SI, judgement and insight good, Euthymic mood. Full Affect.   Recent Results (from the past 2160 hour(s))  Comprehensive metabolic panel     Status: Abnormal   Collection Time: 12/05/16  8:44 AM  Result Value Ref Range   Glucose 92 65 - 99 mg/dL   BUN 12 6 - 24 mg/dL   Creatinine, Ser 9.56 0.57 - 1.00 mg/dL   GFR calc non Af Amer 79 >59 mL/min/1.73   GFR calc Af Amer 91 >59 mL/min/1.73   BUN/Creatinine Ratio 14 9 - 23   Sodium 141 134 - 144 mmol/L   Potassium 4.6 3.5 - 5.2 mmol/L   Chloride 107 (H) 96 - 106 mmol/L   CO2 23 20 - 29 mmol/L   Calcium 9.2 8.7 - 10.2 mg/dL   Total Protein 6.5 6.0 - 8.5 g/dL   Albumin 4.1 3.5 - 5.5 g/dL   Globulin, Total 2.4 1.5 - 4.5 g/dL   Albumin/Globulin Ratio 1.7 1.2 - 2.2   Bilirubin Total 0.4 0.0 - 1.2 mg/dL   Alkaline Phosphatase 49 39 - 117 IU/L   AST 16 0 - 40 IU/L   ALT 9 0 - 32 IU/L  CBC with Differential/Platelet     Status: None   Collection Time: 12/05/16  8:44 AM  Result Value Ref Range     WBC 4.2 3.4 - 10.8 x10E3/uL   RBC 4.46 3.77 - 5.28 x10E6/uL   Hemoglobin 12.7 11.1 - 15.9 g/dL   Hematocrit 21.3 08.6 - 46.6 %   MCV 88 79 - 97 fL   MCH 28.5 26.6 - 33.0 pg   MCHC 32.5 31.5 - 35.7 g/dL   RDW 57.8 46.9 - 62.9 %   Platelets 280 150 - 379 x10E3/uL   Neutrophils 56 Not Estab. %   Lymphs 34 Not Estab. %   Monocytes 7 Not Estab. %   Eos 2 Not Estab. %   Basos 1 Not Estab. %   Neutrophils Absolute 2.4 1.4 - 7.0 x10E3/uL   Lymphocytes Absolute 1.4 0.7 - 3.1 x10E3/uL   Monocytes Absolute 0.3 0.1 - 0.9 x10E3/uL   EOS (ABSOLUTE) 0.1 0.0 - 0.4 x10E3/uL   Basophils Absolute 0.1 0.0 - 0.2 x10E3/uL   Immature Granulocytes 0 Not Estab. %   Immature Grans (Abs) 0.0 0.0 - 0.1 x10E3/uL  TSH     Status: None   Collection Time: 12/05/16  8:44 AM  Result Value Ref Range   TSH 1.290 0.450 - 4.500 uIU/mL  VITAMIN D 25 Hydroxy (Vit-D Deficiency, Fractures)     Status: Abnormal   Collection Time: 12/05/16  8:44 AM  Result Value Ref Range   Vit D, 25-Hydroxy 25.5 (L) 30.0 - 100.0 ng/mL    Comment: Vitamin D deficiency has been defined by the Institute of Medicine and an Endocrine Society practice guideline as a level of serum 25-OH vitamin D less than 20 ng/mL (1,2). The Endocrine Society went on to further define vitamin D insufficiency as a level between 21 and 29 ng/mL (2). 1. IOM (Institute of  Medicine). 2010. Dietary reference    intakes for calcium and D. [place]: The    [employer]. 2. Holick MF, Binkley Crofton, Bischoff-Ferrari HA, et al.    Evaluation, treatment, and prevention of vitamin D    deficiency: an Endocrine Society clinical practice    guideline. JCEM. 2011 Jul; 96(7):1911-30.   Lipid panel     Status: Abnormal   Collection Time: 12/05/16  8:44 AM  Result Value Ref Range   Cholesterol, Total 193 100 - 199 mg/dL   Triglycerides 045106 0 - 149 mg/dL   HDL 65 >40>39 mg/dL   VLDL Cholesterol Cal 21 5 - 40 mg/dL   LDL Calculated 981107 (H) 0 - 99 mg/dL    Chol/HDL Ratio 3.0 0.0 - 4.4 ratio    Comment:                                   T. Chol/HDL Ratio                                             Men  Women                               1/2 Avg.Risk  3.4    3.3                                   Avg.Risk  5.0    4.4                                2X Avg.Risk  9.6    7.1                                3X Avg.Risk 23.4   11.0   Hemoglobin A1c     Status: None   Collection Time: 12/05/16  8:44 AM  Result Value Ref Range   Hgb A1c MFr Bld 5.1 4.8 - 5.6 %    Comment:          Pre-diabetes: 5.7 - 6.4          Diabetes: >6.4          Glycemic control for adults with diabetes: <7.0    Est. average glucose Bld gHb Est-mCnc 100 mg/dL

## 2016-12-20 NOTE — Patient Instructions (Addendum)
Health Goals- -  At least 15k steps per day -   Preventive Care for Adults  A healthy lifestyle and preventive care can promote health and wellness. Preventive health guidelines for men include the following key practices:  .   A routine yearly physical is a good way to check with your health care provider about your health and preventative screening. It is a chance to share any concerns and updates on your health and to receive a thorough exam.  .  Visit your dentist for a routine exam and preventative care every 6 months. Brush your teeth twice a day and floss once a day. Good oral hygiene prevents tooth decay and gum disease.  .  The frequency of eye exams is based on your age, health, family medical history, use of contact lenses, and other factors.  Follow your health care provider's recommendations for frequency of eye exams.  .  Eat a healthy diet.  Foods such as vegetables, fruits, whole grains, low-fat dairy products, and lean protein foods contain the nutrients you need without too many calories.  Decrease your intake of foods high in solid fats, added sugars, and salt.  Eat the right amount of calories for you.  Get information about a proper diet from your health care provider, if necessary.  .  Regular physical exercise is one of the most important things you can do for your health.  Most adults should get at least 150 minutes of moderate-intensity exercise (any activity that increases your heart rate and causes you to sweat) each week.  In addition, most adults need muscle-strengthening exercises on 2 or more days a week.  .  Maintain a healthy weight. The body mass index (BMI) is a screening tool to identify possible weight problems. It provides an estimate of body fat based on height and weight. Your health care provider can find your BMI and can help you achieve or maintain a healthy weight. For adults 20 years and older: A BMI below 18.5 is considered underweight. A BMI of  18.5 to 24.9 is normal. A BMI of 25 to 29.9 is considered overweight. A BMI of 30 and above is considered obese.  .  Maintain normal blood lipids and cholesterol levels by exercising and minimizing your intake of saturated fat. Eat a balanced diet with plenty of fruit and vegetables. Blood tests for lipids and cholesterol should begin at age 54 and be repeated every 5 years. If your lipid or cholesterol levels are high, you are over 50, or you are at high risk for heart disease, you may need your cholesterol levels checked more frequently. Ongoing high lipid and cholesterol levels should be treated with medicines if diet and exercise are not working.  .  If you smoke, find out from your health care provider how to quit. If you do not use tobacco, do not start.  . If you choose to drink alcohol, do not have more than 2 drinks per day. One drink is considered to be 12 ounces (355 mL) of beer, 5 ounces (148 mL) of wine, or 1.5 ounces (44 mL) of liquor.  Marland Kitchen Avoid use of street drugs. Do not share needles with anyone. Ask for help if you need support or instructions about stopping the use of drugs.  . High blood pressure causes heart disease and increases the risk of stroke. Your blood pressure should be checked at least every 1-2 years. Ongoing high blood pressure should be treated with medicines, if weight loss  and exercise are not effective.  . If you are 245-51 years old, ask your health care provider if you should take aspirin to prevent heart disease.  . Diabetes screening involves taking a blood sample to check your fasting blood sugar level.  This should be done once every 3 years, after age 745, if you are within normal weight and without risk factors for diabetes.  Testing should be considered at a younger age or be carried out more frequently if you are overweight and have at least 1 risk factor for diabetes.  . Colorectal cancer can be detected and often prevented. Most routine  colorectal cancer screening begins at the age of 51 and continues through age 51. However, your health care provider may recommend screening at an earlier age if you have risk factors for colon cancer. On a yearly basis, your health care provider may provide home test kits to check for hidden blood in the stool. Use of a small camera at the end of a tube to directly examine the colon (sigmoidoscopy or colonoscopy) can detect the earliest forms of colorectal cancer. Talk to your health care provider about this at age 51, when routine screening begins. Direct exam of the colon should be repeated every 5-10 years through age 51, unless early forms of precancerous polyps or small growths are found.  .  Lung cancer screening is recommended for adults aged 55-80 years who are at high risk for developing lung cancer because of a history of smoking. A yearly low-dose CT scan of the lungs is recommended for people who have at least a 30-pack-year history of smoking and are a current smoker or have quit within the past 15 years. A pack year of smoking is smoking an average of 1 pack of cigarettes a day for 1 year (for example: 1 pack a day for 30 years or 2 packs a day for 15 years). Yearly screening should continue until the smoker has stopped smoking for at least 15 years. Yearly screening should be stopped for people who develop a health problem that would prevent them from having lung cancer treatment.  . Talk with your health care provider about prostate cancer screening.  . Testicular cancer screening is recommended for adult males. Screening includes self-exam and a health care provider exam. Consult with your health care provider about any symptoms you have or any concerns you have about testicular cancer.  . Use sunscreen. Apply sunscreen liberally and repeatedly throughout the day. You should seek shade when your shadow is shorter than you. Protect yourself by wearing long sleeves, pants, a wide-brimmed  hat, and sunglasses year round, whenever you are outdoors.  . Once a month, do a whole-body skin exam, using a mirror to look at the skin on your back. Tell your health care provider about new moles, moles that have irregular borders, moles that are larger than a pencil eraser, or moles that have changed in shape or color.    ++++++++++++++++++++++++++++++++++++++++++++++++++++++++++++++++++  Stay current with required vaccines (immunizations).  ? Influenza vaccine. All adults should be immunized every year.  ? Tetanus, diphtheria, and acellular pertussis (Td, Tdap) vaccine. An adult who has not previously received Tdap or who does not know his vaccine status should receive 1 dose of Tdap. This initial dose should be followed by tetanus and diphtheria toxoids (Td) booster doses every 10 years. Adults with an unknown or incomplete history of completing a 3-dose immunization series with Td-containing vaccines should begin or complete a primary  immunization series including a Tdap dose. Adults should receive a Td booster every 10 years.  ? Varicella vaccine. An adult without evidence of immunity to varicella should receive 2 doses or a second dose if he has previously received 1 dose.  ? Human papillomavirus (HPV) vaccine. Males aged 13-21 years who have not received the vaccine previously should receive the 3-dose series. Males aged 22-26 years may be immunized. Immunization is recommended through the age of 51 years for any female who has sex with males and did not get any or all doses earlier. Immunization is recommended for any person with an immunocompromised condition through the age of 26 years if he did not get any or all doses earlier. During the 3-dose series, the second dose should be obtained 4-8 weeks after the first dose. The third dose should be obtained 24 weeks after the first dose and 16 weeks after the second dose.  ? Zoster vaccine. One dose is recommended for adults aged 51  years or older unless certain conditions are present.   ? PREVNAR - Pneumococcal 13-valent conjugate (PCV13) vaccine. When indicated, a person who is uncertain of his immunization history and has no record of immunization should receive the PCV13 vaccine. An adult aged 51 years or older who has certain medical conditions and has not been previously immunized should receive 1 dose of PCV13 vaccine. This PCV13 should be followed with a dose of pneumococcal polysaccharide (PPSV23) vaccine. The PPSV23 vaccine dose should be obtained at least 8 weeks after the dose of PCV13 vaccine. An adult aged 51 years or older who has certain medical conditions and previously received 1 or more doses of PPSV23 vaccine should receive 1 dose of PCV13. The PCV13 vaccine dose should be obtained 1 or more years after the last PPSV23 vaccine dose.   ? PNEUMOVAX - Pneumococcal polysaccharide (PPSV23) vaccine. When PCV13 is also indicated, PCV13 should be obtained first. All adults aged 51 years and older should be immunized. An adult younger than age 10065 years who has certain medical conditions should be immunized. Any person who resides in a nursing home or long-term care facility should be immunized. An adult smoker should be immunized. People with an immunocompromised condition and certain other conditions should receive both PCV13 and PPSV23 vaccines. People with human immunodeficiency virus (HIV) infection should be immunized as soon as possible after diagnosis. Immunization during chemotherapy or radiation therapy should be avoided. Routine use of PPSV23 vaccine is not recommended for [place] Indians, [address] [place], or people younger than 65 years unless there are medical conditions that require PPSV23 vaccine. When indicated, people who have unknown immunization and have no record of immunization should receive PPSV23 vaccine. One-time revaccination 5 years after the first dose of PPSV23 is recommended for people aged 19-64  years who have chronic kidney failure, nephrotic syndrome, asplenia, or immunocompromised conditions. People who received 1-2 doses of PPSV23 before age 51 years should receive another dose of PPSV23 vaccine at age 51 years or later if at least 5 years have passed since the previous dose. Doses of PPSV23 are not needed for people immunized with PPSV23 at or after age 51 years.   ? Hepatitis A vaccine. Adults who wish to be protected from this disease, have certain high-risk conditions, work with hepatitis A-infected animals, work in hepatitis A research labs, or travel to or work in countries with a high rate of hepatitis A should be immunized. Adults who were previously unvaccinated and who anticipate close contact  with an international adoptee during the first 60 days after arrival in the [place] from a country with a high rate of hepatitis A should be immunized.  ? Hepatitis B vaccine. Adults should be immunized if they wish to be protected from this disease, have certain high-risk conditions, may be exposed to blood or other infectious body fluids, are household contacts or sex partners of hepatitis B positive people, are clients or workers in certain care facilities, or travel to or work in countries with a high rate of hepatitis B.     Preventive Service / Frequency  . Ages 5 to 80  Blood pressure check.  Lipid and cholesterol check  Lung cancer screening. / Every year if you are aged 55-80 years and have a 30-pack-year history of smoking and currently smoke or have quit within the past 15 years. Yearly screening is stopped once you have quit smoking for at least 15 years or develop a health problem that would prevent you from having lung cancer treatment.  Fecal occult blood test (FOBT) of stool. / Every year beginning at age 39 and continuing until age 56. You may not have to do this test if you get a colonoscopy every 10 years.  Flexible sigmoidoscopy** or colonoscopy.** /  Every 5 years for a flexible sigmoidoscopy or every 10 years for a colonoscopy beginning at age 95 and continuing until age 55. Screening for abdominal aortic aneurysm (AAA) by ultrasound is recommended for people who have history of high blood pressure or who are current or former smokers.  ++++++++++++++++++++++++++++++++++++++++++++++++++++++++++++++  Recommend Adult Low Dose Aspirin or coated Aspirin 81 mg daily To reduce risk of Colon Cancer 20 % Skin Cancer 26 %  Melanoma 46% and Pancreatic cancer 60%  +++++++++++++++++++++++++++++++++++++++++++++++++++++++++++++  Vitamin D goal is between 50-100. Please make sure that you are taking your Vitamin D as directed.  It is very important as a natural anti-inflammatory - helping with muscle and joint aches; as well as helping hair, skin, and nails; as well as reducing stroke, heart attack and cancer risk. It helps your bones and helps with mood. It also decreases numerous cancer risks so please take it as directed.  - Low Vit D is associated with a 200-300% higher risk for CANCER and 200-300% higher risk for HEART ATTACK & STROKE.  It is also associated with higher death rate at younger ages, autoimmune diseases like Rheumatoid arthritis, Lupus, Multiple Sclerosis; also many other serious conditions, like depression, Alzheimer's Dementia, infertility, muscle aches, fatigue, fibromyalgia - just to name a few.  +++++++++++++++++++++++++++++++++++++++++++++++++++++++++++  Recommend the book "The END of DIETING" by Dr Monico Hoar & the book "The END of DIABETES " by Dr Monico Hoar At [email or web address] - get book & Audio CD's   --->Being diabetic has a 300% increased risk for heart attack, stroke, cancer, and alzheimer- type vascular dementia. It is very important that you work harder with diet by avoiding all foods that are white. Avoid white rice (brown & wild rice is OK), white potatoes (sweet potatoes in moderation is OK), White bread or  wheat bread or anything made out of white flour like bagels, donuts, rolls, buns, biscuits, cakes, pastries, cookies, pizza crust, and pasta (made from white flour & egg whites) - vegetarian pasta or spinach or wheat pasta is OK. Multigrain breads like Arnold's or [employer], or multigrain sandwich thins or flatbreads. Diet, exercise and weight loss can reverse and cure diabetes in the early stages. Diet, exercise and weight loss  is very important in the control and prevention of complications of diabetes which affects every system in your body, ie. Brain - dementia/stroke, eyes - glaucoma/blindness, heart - heart attack/heart failure, kidneys - dialysis, stomach - gastric paralysis, intestines - malabsorption, nerves - severe painful neuritis, circulation - gangrene & loss of a leg(s), and finally cancer and Alzheimers.  I recommend avoid fried & greasy foods, sweets/candy, white rice (brown or wild rice or Quinoa is OK), white potatoes (sweet potatoes are OK) - anything made from white flour - bagels, doughnuts, rolls, buns, biscuits,white and wheat breads, pizza crust and traditional pasta made of white flour & egg white(vegetarian pasta or spinach or wheat pasta is OK). Multi-grain bread is OK - like multi-grain flat bread or sandwich thins. Avoid alcohol in excess.  Exercise is also important. Eat all the vegetables you want - avoid fatty meats, especially red meat and dairy - especially cheese. Cheese is the most concentrated form of trans-fats which is the worst thing to clog up our arteries. Veggie cheese is OK which can be found in the fresh produce section at [employer] or [employer] or [employer].  ++++++++++++++++++++++ DASH Eating Plan  DASH stands for "Dietary Approaches to Stop Hypertension."  The DASH eating plan is a healthy eating plan that has been shown to reduce high blood pressure (hypertension). Additional health benefits may include reducing the risk of type 2 diabetes  mellitus, heart disease, and stroke. The DASH eating plan may also help with weight loss.  WHAT DO I NEED TO KNOW ABOUT THE DASH EATING PLAN? For the DASH eating plan, you will follow these general guidelines: . Choose foods with a percent daily value for sodium of less than 5% (as listed on the food label). . Use salt-free seasonings or herbs instead of table salt or sea salt. . Check with your health care provider or pharmacist before using salt substitutes. . Eat lower-sodium products, often labeled as "lower sodium" or "no salt added." . Eat fresh foods. . Eat more vegetables, fruits, and low-fat dairy products. . Choose whole grains. Look for the word "whole" as the first word in the ingredient list. . Choose fish . Limit sweets, desserts, sugars, and sugary drinks. . Choose heart-healthy fats. . Eat veggie cheese . Eat more home-cooked food and less restaurant, buffet, and fast food. . Limit fried foods. Adriana Simas foods using methods other than frying. . Limit canned vegetables. If you do use them, rinse them well to decrease the sodium. . When eating at a restaurant, ask that your food be prepared with less salt, or no salt if possible.   WHAT FOODS CAN I EAT? Read Dr Francis Dowse Fuhrman's books on The End of Dieting & The End of Diabetes  Grains Whole grain or whole wheat bread. Brown rice. Whole grain or whole wheat pasta. Quinoa, bulgur, and whole grain cereals. Low-sodium cereals. Corn or whole wheat flour tortillas. Whole grain cornbread. Whole grain crackers. Low-sodium crackers.  Vegetables Fresh or frozen vegetables (raw, steamed, roasted, or grilled). Low-sodium or reduced-sodium tomato and vegetable juices. Low-sodium or reduced-sodium tomato sauce and paste. Low-sodium or reduced-sodium canned vegetables.  Fruits All fresh, canned (in natural juice), or frozen fruits.  Protein Products All fish and seafood. Dried beans, peas, or lentils. Unsalted nuts and  seeds. Unsalted canned beans.  Dairy Low-fat dairy products, such as skim or 1% milk, 2% or reduced-fat cheeses, low-fat ricotta or cottage cheese, or plain low-fat yogurt. Low-sodium or reduced-sodium cheeses.  Fats  and Oils Tub margarines without trans fats. Light or reduced-fat mayonnaise and salad dressings (reduced sodium). Avocado. Safflower, olive, or canola oils. Natural peanut or almond butter.  Other Unsalted popcorn and pretzels. The items listed above may not be a complete list of recommended foods or beverages. Contact your dietitian for more options.  ++++++++++++++++++++++++++++++++++++++++++++++++++++++++++++++++  WHAT FOODS ARE NOT RECOMMENDED?  Grains/ White flour or wheat flour White bread. White pasta. White rice. Refined cornbread. Bagels and croissants. Crackers that contain trans fat. Vegetables Creamed or fried vegetables. Vegetables in a . Regular canned vegetables. Regular canned tomato sauce and paste. Regular tomato and vegetable juices. Fruits Dried fruits. Canned fruit in light or heavy syrup. Fruit juice. Meat and Other Protein Products Meat in general - RED meat & White meat. Fatty cuts of meat. Ribs, chicken wings, all processed meats as bacon, sausage, bologna, salami, fatback, hot dogs, bratwurst and packaged luncheon meats. Dairy Whole or 2% milk, cream, half-and-half, and cream cheese. Whole-fat or sweetened yogurt. Full-fat cheeses or blue cheese. Non-dairy creamers and whipped toppings. Processed cheese, cheese spreads, or cheese curds.  Condiments Onion and garlic salt, seasoned salt, table salt, and sea salt. Canned and packaged gravies. Worcestershire sauce. Tartar sauce. Barbecue sauce. Teriyaki sauce. Soy sauce, including reduced sodium. Steak sauce. Fish sauce. Oyster sauce. Cocktail sauce. Horseradish. Ketchup and mustard. Meat flavorings and tenderizers. Bouillon cubes. Hot sauce. Tabasco sauce. Marinades. Taco seasonings. Relishes. Fats and  Oils Butter, stick margarine, lard, shortening and bacon fat. Coconut, palm kernel, or palm oils. Regular salad dressings. Pickles and olives. Salted popcorn and pretzels. The items listed above may not be a complete list of foods and beverages to avoid.

## 2016-12-20 NOTE — Assessment & Plan Note (Signed)
A1c- WNL's - reassured pt we should check yrly due to fam hx  - keep a N BMI, prudent diet and exercise daily is best prevention ag DM

## 2016-12-20 NOTE — Assessment & Plan Note (Addendum)
Dietary changes such as low saturated & trans fat discussed with patient.  Encouraged regular exercise   Educational handouts provided at patient's desire.  Contact us prior with any Q's/ concerns.

## 2016-12-20 NOTE — Assessment & Plan Note (Signed)
cont daily exercise of walking at least 45 min QD at lunch and eat healthy fats

## 2016-12-20 NOTE — Assessment & Plan Note (Signed)
Will cont with yrly Derm OV's and skin screenings  - STRONGLY encouraged sunscreen which pt has not been using as evidence by immense tan she currently has

## 2016-12-20 NOTE — Assessment & Plan Note (Signed)
-   Wkly supp * 12 wks  plus 5k IU QD indefinitely  - reck 6 mo

## 2017-01-03 ENCOUNTER — Encounter: Payer: Self-pay | Admitting: Family Medicine

## 2017-03-12 ENCOUNTER — Other Ambulatory Visit: Payer: Self-pay | Admitting: Family Medicine

## 2017-03-12 DIAGNOSIS — E559 Vitamin D deficiency, unspecified: Secondary | ICD-10-CM

## 2017-05-24 ENCOUNTER — Other Ambulatory Visit: Payer: 59

## 2017-05-31 ENCOUNTER — Ambulatory Visit: Payer: 59 | Admitting: Family Medicine

## 2017-06-19 DIAGNOSIS — Z1231 Encounter for screening mammogram for malignant neoplasm of breast: Secondary | ICD-10-CM | POA: Diagnosis not present

## 2017-06-19 DIAGNOSIS — Z6826 Body mass index (BMI) 26.0-26.9, adult: Secondary | ICD-10-CM | POA: Diagnosis not present

## 2017-06-19 DIAGNOSIS — Z01419 Encounter for gynecological examination (general) (routine) without abnormal findings: Secondary | ICD-10-CM | POA: Diagnosis not present

## 2017-06-22 ENCOUNTER — Other Ambulatory Visit: Payer: Self-pay | Admitting: Obstetrics and Gynecology

## 2017-06-22 DIAGNOSIS — R928 Other abnormal and inconclusive findings on diagnostic imaging of breast: Secondary | ICD-10-CM

## 2017-06-26 DIAGNOSIS — N951 Menopausal and female climacteric states: Secondary | ICD-10-CM | POA: Diagnosis not present

## 2017-07-11 ENCOUNTER — Other Ambulatory Visit: Payer: 59

## 2017-07-12 ENCOUNTER — Ambulatory Visit
Admission: RE | Admit: 2017-07-12 | Discharge: 2017-07-12 | Disposition: A | Discharge status: Home / Self Care | Payer: BLUE CROSS/BLUE SHIELD | Source: Ambulatory Visit | Attending: Obstetrics and Gynecology | Admitting: Obstetrics and Gynecology

## 2017-07-12 ENCOUNTER — Other Ambulatory Visit: Payer: Self-pay | Admitting: Obstetrics and Gynecology

## 2017-07-12 DIAGNOSIS — R928 Other abnormal and inconclusive findings on diagnostic imaging of breast: Secondary | ICD-10-CM

## 2017-07-12 DIAGNOSIS — R921 Mammographic calcification found on diagnostic imaging of breast: Secondary | ICD-10-CM | POA: Diagnosis not present

## 2017-07-18 ENCOUNTER — Ambulatory Visit: Payer: 59 | Admitting: Family Medicine

## 2017-07-18 DIAGNOSIS — M7062 Trochanteric bursitis, left hip: Secondary | ICD-10-CM | POA: Diagnosis not present

## 2017-08-30 DIAGNOSIS — Z1211 Encounter for screening for malignant neoplasm of colon: Secondary | ICD-10-CM | POA: Diagnosis not present

## 2017-08-30 DIAGNOSIS — Z86010 Personal history of colonic polyps: Secondary | ICD-10-CM | POA: Insufficient documentation

## 2017-08-30 DIAGNOSIS — Z860101 Personal history of adenomatous and serrated colon polyps: Secondary | ICD-10-CM | POA: Insufficient documentation

## 2017-08-30 LAB — HM COLONOSCOPY

## 2017-09-24 DIAGNOSIS — M7062 Trochanteric bursitis, left hip: Secondary | ICD-10-CM | POA: Diagnosis not present

## 2017-10-03 DIAGNOSIS — M1612 Unilateral primary osteoarthritis, left hip: Secondary | ICD-10-CM | POA: Insufficient documentation

## 2017-11-06 DIAGNOSIS — M7062 Trochanteric bursitis, left hip: Secondary | ICD-10-CM | POA: Diagnosis not present

## 2017-11-30 DIAGNOSIS — M7062 Trochanteric bursitis, left hip: Secondary | ICD-10-CM | POA: Diagnosis not present

## 2017-11-30 DIAGNOSIS — M1612 Unilateral primary osteoarthritis, left hip: Secondary | ICD-10-CM | POA: Diagnosis not present

## 2017-11-30 DIAGNOSIS — M48061 Spinal stenosis, lumbar region without neurogenic claudication: Secondary | ICD-10-CM | POA: Diagnosis not present

## 2017-12-03 ENCOUNTER — Encounter: Payer: BLUE CROSS/BLUE SHIELD | Admitting: Family Medicine

## 2017-12-13 DIAGNOSIS — M51369 Other intervertebral disc degeneration, lumbar region without mention of lumbar back pain or lower extremity pain: Secondary | ICD-10-CM | POA: Insufficient documentation

## 2017-12-13 DIAGNOSIS — M5136 Other intervertebral disc degeneration, lumbar region: Secondary | ICD-10-CM | POA: Insufficient documentation

## 2017-12-27 DIAGNOSIS — N760 Acute vaginitis: Secondary | ICD-10-CM | POA: Diagnosis not present

## 2017-12-27 DIAGNOSIS — N390 Urinary tract infection, site not specified: Secondary | ICD-10-CM | POA: Diagnosis not present

## 2017-12-31 ENCOUNTER — Encounter: Payer: Self-pay | Admitting: Family Medicine

## 2018-01-09 ENCOUNTER — Encounter: Payer: BLUE CROSS/BLUE SHIELD | Admitting: Family Medicine

## 2018-01-09 ENCOUNTER — Encounter: Payer: Self-pay | Admitting: Family Medicine

## 2018-01-09 NOTE — Progress Notes (Incomplete)
Impression and Recommendations:    No diagnosis found.   1) Anticipatory Guidance: Discussed importance of wearing a seatbelt while driving, not texting while driving; sunscreen when outside along with yearly skin surveillance; eating a well balanced and modest diet; physical activity at least 25 minutes per day or 150 min/ week of moderate to intense activity.  2) Immunizations / Screenings / Labs:  All immunizations and screenings that patient agrees to, are up-to-date per recommendations or will be updated today.  Patient understands the needs for q 80mo dental and yearly vision screens which pt will schedule independently. Obtain CBC, CMP, HgA1c, Lipid panel, TSH and vit D when fasting if not already done recently.   3) Weight:   Discussed goal of losing even 5-10% of current body weight which would improve overall feelings of well being and improve objective health data significantly.   Improve nutrient density of diet through increasing intake of fruits and vegetables and decreasing saturated/trans fats, white flour products and refined sugar products.   4) ***  No orders of the defined types were placed in this encounter.   No orders of the defined types were placed in this encounter.   Gross side effects, risk and benefits, and alternatives of medications discussed with patient.  Patient is aware that all medications have potential side effects and we are unable to predict every side effect or drug-drug interaction that may occur.  Expresses verbal understanding and consents to current therapy plan and treatment regimen.  F-up preventative CPE in 1 year. F/up sooner for chronic care management as discussed and/or prn.  Please see orders placed and AVS handed out to patient at the end of our visit for further patient instructions/ counseling done pertaining to today's office visit.   Lauren Rocky CraftsE Pearson 8:06 AM     Subjective:   No chief complaint on file.  CC:    HPI: Deanna Barrett is a 52 y.o. female who [facility] today a yearly health maintenance exam.  Health Maintenance Summary Reviewed and updated, unless pt declines services.  Colonoscopy:     (Unnecessary secondary to < 3450 or > 52 years old.) Tobacco History Reviewed:   Y  CT scan for screening lung CA:   *** Abdominal Ultrasound:     ( Unnecessary secondary to < 3265 or > 52 years old) Alcohol:    No concerns, no excessive use Exercise Habits:   Not meeting AHA guidelines STD concerns:   none Drug Use:   None Birth control method:   n/a Menses regular:     n/a Lumps or breast concerns:      no Breast Cancer Family History:      No Bone/ DEXA scan:    *** ( Unnecessary due to < 65 and average risk)    There is no immunization history on file for this patient.  Health Maintenance  Topic Date Due   TETANUS/TDAP  03/06/1985   INFLUENZA VACCINE  12/20/2017   HIV Screening  11/28/2028 (Originally [birth year]06[birth year]1981)   PAP SMEAR  05/11/2019   MAMMOGRAM  07/13/2019   COLONOSCOPY  08/31/2027     Wt Readings from Last 3 Encounters:  12/20/16 154 lb 6.4 oz (70 kg)  11/28/16 156 lb 6.4 oz (70.9 kg)   BP Readings from Last 3 Encounters:  12/20/16 130/80  11/28/16 122/83   Pulse Readings from Last 3 Encounters:  12/20/16 70  11/28/16 73  Past Medical History:  Diagnosis Date   Vitamin D deficiency       Past Surgical History:  Procedure Laterality Date   ABDOMINAL HYSTERECTOMY     partial   KNEE ARTHROSCOPY     SPINE SURGERY        Family History  Problem Relation Age of Onset   Cancer Father        lung   Hypertension Father    Breast cancer Paternal Grandmother       Social History   Substance and Sexual Activity  Drug Use No  ,   Social History   Substance and Sexual Activity  Alcohol Use Yes   Comment: occ.  ,   Social History   Tobacco Use  Smoking Status Former Smoker   Packs/day: 0.50    Years: 25.00   Pack years: 12.50   Last attempt to quit: 05/14/2013   Years since quitting: 4.6  Smokeless Tobacco Former NeurosurgeonUser  ,   Social History   Substance and Sexual Activity  Sexual Activity Yes    Current Outpatient Medications on File Prior to Visit  Medication Sig Dispense Refill   Cholecalciferol (VITAMIN D3) 5000 units TABS 5,000 IU OTC vitamin D3 daily. 90 tablet 3   Doxylamine Succinate, Sleep, (SLEEP AID PO) Take 1 tablet by mouth at bedtime.     fluticasone (FLONASE) 50 MCG/ACT nasal spray Place 1 spray into both nostrils 2 (two) times daily. 16 g 6   ibuprofen (ADVIL,MOTRIN) 100 MG/5ML suspension Take 200 mg by mouth every 4 (four) hours as needed.     Vitamin D, Ergocalciferol, (DRISDOL) 50000 units CAPS capsule TAKE 1 CAPSULE (50,000 UNITS TOTAL) BY MOUTH EVERY 7 (SEVEN) DAYS. 12 capsule 0   No current facility-administered medications on file prior to visit.     Allergies: Patient has no known allergies.  Review of Systems: General:   Denies fever, chills, unexplained weight loss.  Optho/Auditory:   Denies visual changes, blurred vision/LOV Respiratory:   Denies SOB, DOE more than baseline levels.  Cardiovascular:   Denies chest pain, palpitations, new onset peripheral edema  Gastrointestinal:   Denies nausea, vomiting, diarrhea.  Genitourinary: Denies dysuria, freq/ urgency, flank pain or discharge from genitals.  Endocrine:     Denies hot or cold intolerance, polyuria, polydipsia. Musculoskeletal:   Denies unexplained myalgias, joint swelling, unexplained arthralgias, gait problems.  Skin:  Denies rash, suspicious lesions Neurological:     Denies dizziness, unexplained weakness, numbness  Psychiatric/Behavioral:   Denies mood changes, suicidal or homicidal ideations, hallucinations    Objective:    There were no vitals taken for this visit. There is no height or weight on file to calculate BMI. General Appearance:    Alert, cooperative, no  distress, appears stated age  Head:    Normocephalic, without obvious abnormality, atraumatic  Eyes:    PERRL, conjunctiva/corneas clear, EOM's intact, fundi    benign, both eyes  Ears:    Normal TM's and external ear canals, both ears  Nose:   Nares normal, septum midline, mucosa normal, no drainage    or sinus tenderness  Throat:   Lips w/o lesion, mucosa moist, and tongue normal; teeth and   gums normal  Neck:   Supple, symmetrical, trachea midline, no adenopathy;    thyroid:  no enlargement/tenderness/nodules; no carotid   bruit or JVD  Back:     Symmetric, no curvature, ROM normal, no CVA tenderness  Lungs:     Clear to auscultation bilaterally,  respirations unlabored, no       Wh/ R/ R  Chest Wall:    No tenderness or gross deformity; normal excursion   Heart:    Regular rate and rhythm, S1 and S2 normal, no murmur, rub   or gallop  Breast Exam:    No tenderness, masses, or nipple abnormality b/l; no d/c  Abdomen:     Soft, non-tender, bowel sounds active all four quadrants, NO   G/R/R, no masses, no organomegaly  Genitalia:    Ext genitalia: without lesion, no rash or discharge, No         tenderness;  Cervix: WNL's w/o discharge or lesion;        Adnexa:  No tenderness or palpable masses   Rectal:    Normal tone, no masses or tenderness;   guaiac negative stool  Extremities:   Extremities normal, atraumatic, no cyanosis or gross edema  Pulses:   2+ and symmetric all extremities  Skin:   Warm, dry, Skin color, texture, turgor normal, no obvious rashes or lesions Psych: No HI/SI, judgement and insight good, Euthymic mood. Full Affect.  Neurologic:   CNII-XII intact, normal strength, sensation and reflexes    Throughout   DEXA:    [email or web address]   Based on the [place] FRAX tool, a 52 year old white woman with no other risk factors has a 9.3% 10-year risk for any osteoporotic fracture. White women between the ages of 58 and 32 years with equivalent or greater 10-year  fracture risks based on specific risk factors include but are not limited to the following persons: 43) a 52 year old current smoker with a BMI less than 21 kg/m2, daily alcohol use, and parental fracture history; 34) a 52 year old woman with a parental fracture history; 52) a 52 year old woman with a BMI less than 21 kg/m2 and daily alcohol use; and 4) a 52 year old current smoker with daily alcohol use.   The FRAX tool also predicts 10-year fracture risks for black, Asian, and Hispanic women in the [place]. In general, estimated fracture risks in nonwhite women are lower than those for white women of the same age. Although the USPSTF recommends using a 9.3% 10-year fracture risk threshold to screen women aged 30 to 44 years,

## 2018-01-16 DIAGNOSIS — M25512 Pain in left shoulder: Secondary | ICD-10-CM | POA: Diagnosis not present

## 2018-01-16 DIAGNOSIS — Z9889 Other specified postprocedural states: Secondary | ICD-10-CM | POA: Diagnosis not present

## 2018-01-17 DIAGNOSIS — M961 Postlaminectomy syndrome, not elsewhere classified: Secondary | ICD-10-CM | POA: Insufficient documentation

## 2018-02-18 ENCOUNTER — Ambulatory Visit (INDEPENDENT_AMBULATORY_CARE_PROVIDER_SITE_OTHER): Payer: BLUE CROSS/BLUE SHIELD | Admitting: Family Medicine

## 2018-02-18 ENCOUNTER — Encounter: Payer: Self-pay | Admitting: Family Medicine

## 2018-02-18 VITALS — BP 119/80 | HR 71 | Ht 66.0 in | Wt 165.5 lb

## 2018-02-18 DIAGNOSIS — R7989 Other specified abnormal findings of blood chemistry: Secondary | ICD-10-CM

## 2018-02-18 DIAGNOSIS — E7800 Pure hypercholesterolemia, unspecified: Secondary | ICD-10-CM

## 2018-02-18 DIAGNOSIS — Z86010 Personal history of colonic polyps: Secondary | ICD-10-CM | POA: Diagnosis not present

## 2018-02-18 DIAGNOSIS — Z0000 Encounter for general adult medical examination without abnormal findings: Secondary | ICD-10-CM | POA: Diagnosis not present

## 2018-02-18 DIAGNOSIS — Z23 Encounter for immunization: Secondary | ICD-10-CM | POA: Diagnosis not present

## 2018-02-18 DIAGNOSIS — E559 Vitamin D deficiency, unspecified: Secondary | ICD-10-CM | POA: Diagnosis not present

## 2018-02-18 NOTE — Progress Notes (Signed)
Impression and Recommendations:    1. Need for Tdap vaccination   2. Encounter for wellness examination   3. History of adenomatous polyp of colon   4. Vitamin D insufficiency-  was 25.5 in 7/18   5. Elevated LDL cholesterol level-  107 in 7/18   6. High serum HDL  ( 65 in 7/18)      Dermatology -Discussed importance of regular screenings with age and family history -Encouraged pt to do self skin screenings at least twice per year -Encouraged pt and spouse to conduct skin screenings on each other for full visibility -Discussed current technology techniques of comparing changes in dermatology  -Discussed red flag symptoms of skin changes including large, black marks on the skin -Encouraged pt to measure unusual spots and take pictures or notes for comparison  Colonoscopy -Reviewed results of last colonoscopy with pt  -Discussed importance of regular screenings due to personal history of adenomatous polyps -Instructed pt to return for next colonoscopy in 5 years per colonoscopy findings -Discussed cologuard for colonoscopy testing in the years between colonoscopies -Instructed pt to avoid using cologuard when pt's hemorrhoids are active  Lung Cancer Screening -Educated pt about recommendations for lung cancer screenings -Discussed risk categories based on family history and pack year history -Encouraged pt to call her insurance to see if they will cover it but notified her that she is not high risk  Ears -Instructed pt to stop using q-tips and foreign objects in her ear canals -Instructed pt to use 50/50 solution of hydrogen peroxide and rubbing alcohol  -Discussed negative impact of foreign objects in ear canal  Dental -Pt regularly sees dentist for evaluation and care  Eyes -Pt regularly sees optometrist for evaluation and care  Supplements -Directed pt to take at least 5000 IUD each day of vitamin D -Discussed the importance of calcium for women over  50 -Encouraged pt to at least take Vitamin D if the Calcium is causing constipation  Exercise -Encouraged pt find support for lifestyle changes including exercise classes and workout partners -Discussed positive impact of exercise on overall health  -Discussed positive impact of even minor weight loss on BMI and health risks  Immunizations / Screenings / Labs:  -Educated pt on importance of shingrix vaccine in preventing future shingles outbreaks -Discussed increased risk for shingles with recent outbreak -Encouraged pt to call insurance to see where her immunization will be covered -Instructed pt to return for FBW and to make an appointment to discuss results -Discussed importance of regular blood testing and appointments for early detection or prevention of chronic disease  All immunizations and screenings that patient agrees to, are up-to-date per recommendations or will be updated today.  Patient understands the needs for q 12mo dental and yearly vision screens which pt will schedule independently. Obtain CBC, CMP, HgA1c, Lipid panel, TSH and vit D when fasting if not already done recently.   Anticipatory Guidance: Discussed importance of wearing a seatbelt while driving, not texting while driving; sunscreen when outside along with yearly skin surveillance; eating a well balanced and modest diet; physical activity at least 25 minutes per day or 150 min/ week of moderate to intense activity.   Gross side effects, risk and benefits, and alternatives of medications discussed with patient.  Patient is aware that all medications have potential side effects and we are unable to predict every side effect or drug-drug interaction that may occur.  Expresses verbal understanding and consents to current therapy plan and  treatment regimen.  F-up preventative CPE in 1 year. F/up sooner for chronic care management as discussed and/or prn.  Please see orders placed and AVS handed out to patient at the  end of our visit for further patient instructions/ counseling done pertaining to today's office visit.  This document serves as a record of services personally performed by [name], MD. It was created on her behalf by Alphonse Guild, a trained medical scribe. The creation of this record is based on the scribe's personal observations and the provider's statements to them.   I have reviewed the above medical documentation for accuracy and completeness and I concur.  [name], D.O.      Subjective:    Chief Complaint  Patient presents with  . Annual Exam   CC:   HPI: Deanna Barrett is a 52 y.o. female who presents to [facility] at [facility] today a yearly health maintenance exam.  Health Maintenance Summary Reviewed and updated, unless pt declines services.  Shingles -Pt states she had a very mild shingles outbreak within the past year -States she only had a few blisters and "it wasn't bad at all, we caught it early"  GI -Pt has IBS -Denies unusual changes in GI tract  Dental -Sees Dr. [place] -Just had a dental appointment two weeks ago  Eyes -Had a eye examination earlier this year -Pt wears eyeglasses  Dermatology -Pt sees Dr. Nita Sells yearly for skin screenings -Has family history of melanoma  Colonoscopy  -Last exam on 08/2017 -Scan states pt has history of adenomatous polyps  OBGYN -Had a mammogram and PAP smear in Feb 2019 -States physician instructed her to return every year for PAP smears -Pt also instructed to return for mammogram yearly as well  Exercise -States she had been up to 4 miles a day, but fell off after visiting [place] on vacation -Says she has had some issues getting back into her routine of exercise  Family History -Grandmother had breast cancer  Lung Cancer screening -Father had lung cancer at 68 and passed at 23 -States she was not told it was genetic -Pt only has roughly a 12.5 pack year  history -Pt is concerned about screening and curious if she can obtain it  Colonoscopy:  Occurred in 08/2017 Tobacco History Reviewed:   Y  Alcohol:    No concerns, no excessive use Exercise Habits:   Not meeting AHA guidelines STD concerns:   none Drug Use:   None Birth control method:   n/a Menses regular:     n/a Lumps or breast concerns:      no Breast Cancer Family History:      No   Immunization History  Administered Date(s) Administered  . Influenza,inj,quad, With Preservative 02/19/2017  . Tdap 02/18/2018    Health Maintenance  Topic Date Due  . INFLUENZA VACCINE  12/20/2017  . HIV Screening  11/28/2028 (Originally [birth year]1981)  . PAP SMEAR  05/11/2019  . MAMMOGRAM  07/13/2019  . COLONOSCOPY  08/31/2027  . TETANUS/TDAP  02/19/2028     Wt Readings from Last 3 Encounters:  02/18/18 165 lb 8 oz (75.1 kg)  12/20/16 154 lb 6.4 oz (70 kg)  11/28/16 156 lb 6.4 oz (70.9 kg)   BP Readings from Last 3 Encounters:  02/18/18 119/80  12/20/16 130/80  11/28/16 122/83   Pulse Readings from Last 3 Encounters:  02/18/18 71  12/20/16 70  11/28/16 73     Past Medical History:  Diagnosis Date  . Vitamin D deficiency       Past Surgical History:  Procedure Laterality Date  . ABDOMINAL HYSTERECTOMY     partial  . KNEE ARTHROSCOPY    . SPINE SURGERY        Family History  Problem Relation Age of Onset  . Cancer Father        lung  . Hypertension Father   . Breast cancer Paternal Grandmother       Social History   Substance and Sexual Activity  Drug Use No  ,   Social History   Substance and Sexual Activity  Alcohol Use Yes   Comment: occ.  ,   Social History   Tobacco Use  Smoking Status Former Smoker  . Packs/day: 0.50  . Years: 25.00  . Pack years: 12.50  . Last attempt to quit: 05/14/2013  . Years since quitting: 4.8  Smokeless Tobacco Former Neurosurgeon  ,   Social History   Substance and Sexual Activity  Sexual Activity Yes     Current Outpatient Medications on File Prior to Visit  Medication Sig Dispense Refill  . gabapentin (NEURONTIN) 300 MG capsule Take 900 mg by mouth at bedtime.    Marland Kitchen HYDROcodone-acetaminophen (NORCO/VICODIN) 5-325 MG tablet hydrocodone 5 mg-acetaminophen 325 mg tablet  TAKE 1 TABLET BY MOUTH EVERY 6 HOURS PRN FOR 5 DAYS    . ibuprofen (ADVIL,MOTRIN) 100 MG/5ML suspension Take 200 mg by mouth every 4 (four) hours as needed.    . methocarbamol (ROBAXIN) 500 MG tablet Take 1 tablet by mouth at bedtime.     No current facility-administered medications on file prior to visit.     Allergies: Patient has no known allergies.  Review of Systems: General:   Denies fever, chills, unexplained weight loss.  Optho/Auditory:   Denies visual changes, blurred vision/LOV Respiratory:   Denies SOB, DOE more than baseline levels.  Cardiovascular:   Denies chest pain, palpitations, new onset peripheral edema  Gastrointestinal:   Denies nausea, vomiting, diarrhea.  Genitourinary: Denies dysuria, freq/ urgency, flank pain or discharge from genitals.  Endocrine:     Denies hot or cold intolerance, polyuria, polydipsia. Musculoskeletal:   Denies unexplained myalgias, joint swelling, unexplained arthralgias, gait problems.  Skin:  Denies rash, suspicious lesions Neurological:     Denies dizziness, unexplained weakness, numbness  Psychiatric/Behavioral:   Denies mood changes, suicidal or homicidal ideations, hallucinations    Objective:    Blood pressure 119/80, pulse 71, height 5\' 6"  (1.676 m), weight 165 lb 8 oz (75.1 kg), SpO2 98 %. Body mass index is 26.71 kg/m. General Appearance:    Alert, cooperative, no distress, appears stated age  Head:    Normocephalic, without obvious abnormality, atraumatic  Eyes:    PERRL, conjunctiva/corneas clear, EOM's intact, fundi    benign, both eyes  Ears:    Normal TM's and external ear canals, both ears  Nose:   Nares normal, septum midline, mucosa normal, no  drainage    or sinus tenderness  Throat:   Lips w/o lesion, mucosa moist, and tongue normal; teeth and   gums normal  Neck:   Supple, symmetrical, trachea midline, no adenopathy;    thyroid:  no enlargement/tenderness/nodules; no carotid   bruit or JVD  Back:     Symmetric, no curvature, ROM normal, no CVA tenderness  Lungs:     Clear to auscultation bilaterally, respirations unlabored, no       Wh/ R/ R  Chest Wall:  No tenderness or gross deformity; normal excursion   Heart:    Regular rate and rhythm, S1 and S2 normal, no murmur, rub   or gallop  Breast Exam:    No tenderness, masses, or nipple abnormality b/l; no d/c  Abdomen:     Soft, non-tender, bowel sounds active all four quadrants, NO   G/R/R, no masses, no organomegaly  Genitalia:    Ext genitalia: without lesion, no rash or discharge, No         tenderness;  Cervix: WNL's w/o discharge or lesion;        Adnexa:  No tenderness or palpable masses   Rectal:    Normal tone, no masses or tenderness;   guaiac negative stool  Extremities:   Extremities normal, atraumatic, no cyanosis or gross edema  Pulses:   2+ and symmetric all extremities  Skin:   Warm, dry, Skin color, texture, turgor normal, no obvious rashes or lesions Psych: No HI/SI, judgement and insight good, Euthymic mood. Full Affect.  Neurologic:   CNII-XII intact, normal strength, sensation and reflexes    Throughout   DEXA:    [email or web address]   Based on the [place] FRAX tool, a 52 year old white woman with no other risk factors has a 9.3% 10-year risk for any osteoporotic fracture. White women between the ages of 48 and 51 years with equivalent or greater 10-year fracture risks based on specific risk factors include but are not limited to the following persons: 27) a 52 year old current smoker with a BMI less than 21 kg/m2, daily alcohol use, and parental fracture history; 54) a 53 year old woman with a parental fracture history; 33) a 52 year old woman with  a BMI less than 21 kg/m2 and daily alcohol use; and 87) a 52 year old current smoker with daily alcohol use.   The FRAX tool also predicts 10-year fracture risks for black, Asian, and Hispanic women in the [place]. In general, estimated fracture risks in nonwhite women are lower than those for white women of the same age. Although the USPSTF recommends using a 9.3% 10-year fracture risk threshold to screen women aged 1 to 64 years,

## 2018-02-18 NOTE — Patient Instructions (Addendum)
Per your request we will contact you via my chart for lab abnormalities.  Please discontinue using Q-tips and foreign objects in your ears. For cleaning, use a couple drops of 50/50 mix of hydrogen peroxide and rubbing alcohol  -Also please try the FODMAP diet for your IBS or at least look into it and consider it  Also for the Shingrix vaccine please call your insurance and find out if that is okay to get here or if they require you to go somewhere specific to get that vaccine i.e. [facility] or [facility] etc.  Preventive Care for Adults, Female  A healthy lifestyle and preventive care can promote health and wellness. Preventive health guidelines for women include the following key practices.   A routine yearly physical is a good way to check with your health care provider about your health and preventive screening. It is a chance to share any concerns and updates on your health and to receive a thorough exam.   Visit your dentist for a routine exam and preventive care every 6 months. Brush your teeth twice a day and floss once a day. Good oral hygiene prevents tooth decay and gum disease.   The frequency of eye exams is based on your age, health, family medical history, use of contact lenses, and other factors. Follow your health care provider's recommendations for frequency of eye exams.   Eat a healthy diet. Foods like vegetables, fruits, whole grains, low-fat dairy products, and lean protein foods contain the nutrients you need without too many calories. Decrease your intake of foods high in solid fats, added sugars, and salt. Eat the right amount of calories for you.Get information about a proper diet from your health care provider, if necessary.   Regular physical exercise is one of the most important things you can do for your health. Most adults should get at least 150 minutes of moderate-intensity exercise (any activity that increases your heart rate and causes you to sweat) each week. In  addition, most adults need muscle-strengthening exercises on 2 or more days a week.   Maintain a healthy weight. The body mass index (BMI) is a screening tool to identify possible weight problems. It provides an estimate of body fat based on height and weight. Your health care provider can find your BMI, and can help you achieve or maintain a healthy weight.For adults 20 years and older:   - A BMI below 18.5 is considered underweight.   - A BMI of 18.5 to 24.9 is normal.   - A BMI of 25 to 29.9 is considered overweight.   - A BMI of 30 and above is considered obese.   Maintain normal blood lipids and cholesterol levels by exercising and minimizing your intake of trans and saturated fats.  Eat a balanced diet with plenty of fruit and vegetables. Blood tests for lipids and cholesterol should begin at age 56 and be repeated every 5 years minimum.  If your lipid or cholesterol levels are high, you are over 40, or you are at high risk for heart disease, you may need your cholesterol levels checked more frequently.Ongoing high lipid and cholesterol levels should be treated with medicines if diet and exercise are not working.   If you smoke, find out from your health care provider how to quit. If you do not use tobacco, do not start.   Lung cancer screening is recommended for adults aged 24-80 years who are at high risk for developing lung cancer because of a history  of smoking. A yearly low-dose CT scan of the lungs is recommended for people who have at least a 30-pack-year history of smoking and are a current smoker or have quit within the past 15 years. A pack year of smoking is smoking an average of 1 pack of cigarettes a day for 1 year (for example: 1 pack a day for 30 years or 2 packs a day for 15 years). Yearly screening should continue until the smoker has stopped smoking for at least 15 years. Yearly screening should be stopped for people who develop a health problem that would prevent them from  having lung cancer treatment.   If you are pregnant, do not drink alcohol. If you are breastfeeding, be very cautious about drinking alcohol. If you are not pregnant and choose to drink alcohol, do not have more than 1 drink per day. One drink is considered to be 12 ounces (355 mL) of beer, 5 ounces (148 mL) of wine, or 1.5 ounces (44 mL) of liquor.   Avoid use of street drugs. Do not share needles with anyone. Ask for help if you need support or instructions about stopping the use of drugs.   High blood pressure causes heart disease and increases the risk of stroke. Your blood pressure should be checked at least yearly.  Ongoing high blood pressure should be treated with medicines if weight loss and exercise do not work.   If you are 72-36 years old, ask your health care provider if you should take aspirin to prevent strokes.   Diabetes screening involves taking a blood sample to check your fasting blood sugar level. This should be done once every 3 years, after age 18, if you are within normal weight and without risk factors for diabetes. Testing should be considered at a younger age or be carried out more frequently if you are overweight and have at least 1 risk factor for diabetes.   Breast cancer screening is essential preventive care for women. You should practice "breast self-awareness."  This means understanding the normal appearance and feel of your breasts and may include breast self-examination.  Any changes detected, no matter how small, should be reported to a health care provider.  Women in their 52s and 30s should have a clinical breast exam (CBE) by a health care provider as part of a regular health exam every 1 to 3 years.  After age 12, women should have a CBE every year.  Starting at age 76, women should consider having a mammogram (breast X-ray test) every year.  Women who have a family history of breast cancer should talk to their health care provider about genetic screening.   Women at a high risk of breast cancer should talk to their health care providers about having an MRI and a mammogram every year.   -Breast cancer gene (BRCA)-related cancer risk assessment is recommended for women who have family members with BRCA-related cancers. BRCA-related cancers include breast, ovarian, tubal, and peritoneal cancers. Having family members with these cancers may be associated with an increased risk for harmful changes (mutations) in the breast cancer genes BRCA1 and BRCA2. Results of the assessment will determine the need for genetic counseling and BRCA1 and BRCA2 testing.   The Pap test is a screening test for cervical cancer. A Pap test can show cell changes on the cervix that might become cervical cancer if left untreated. A Pap test is a procedure in which cells are obtained and examined from the lower end of  the uterus (cervix).   - Women should have a Pap test starting at age 42.   - Between ages 67 and 25, Pap tests should be repeated every 2 years.   - Beginning at age 55, you should have a Pap test every 3 years as long as the past 3 Pap tests have been normal.   - Some women have medical problems that increase the chance of getting cervical cancer. Talk to your health care provider about these problems. It is especially important to talk to your health care provider if a new problem develops soon after your last Pap test. In these cases, your health care provider may recommend more frequent screening and Pap tests.   - The above recommendations are the same for women who have or have not gotten the vaccine for human papillomavirus (HPV).   - If you had a hysterectomy for a problem that was not cancer or a condition that could lead to cancer, then you no longer need Pap tests. Even if you no longer need a Pap test, a regular exam is a good idea to make sure no other problems are starting.   - If you are between ages 27 and 66 years, and you have had normal Pap tests  going back 10 years, you no longer need Pap tests. Even if you no longer need a Pap test, a regular exam is a good idea to make sure no other problems are starting.   - If you have had past treatment for cervical cancer or a condition that could lead to cancer, you need Pap tests and screening for cancer for at least 20 years after your treatment.   - If Pap tests have been discontinued, risk factors (such as a new sexual partner) need to be reassessed to determine if screening should be resumed.   - The HPV test is an additional test that may be used for cervical cancer screening. The HPV test looks for the virus that can cause the cell changes on the cervix. The cells collected during the Pap test can be tested for HPV. The HPV test could be used to screen women aged 16 years and older, and should be used in women of any age who have unclear Pap test results. After the age of 63, women should have HPV testing at the same frequency as a Pap test.   Colorectal cancer can be detected and often prevented. Most routine colorectal cancer screening begins at the age of 2 years and continues through age 42 years. However, your health care provider may recommend screening at an earlier age if you have risk factors for colon cancer. On a yearly basis, your health care provider may provide home test kits to check for hidden blood in the stool.  Use of a small camera at the end of a tube, to directly examine the colon (sigmoidoscopy or colonoscopy), can detect the earliest forms of colorectal cancer. Talk to your health care provider about this at age 74, when routine screening begins. Direct exam of the colon should be repeated every 5 -10 years through age 2 years, unless early forms of pre-cancerous polyps or small growths are found.   People who are at an increased risk for hepatitis B should be screened for this virus. You are considered at high risk for hepatitis B if:  -You were born in a country where  hepatitis B occurs often. Talk with your health care provider about which countries are considered  high risk.  - Your parents were born in a high-risk country and you have not received a shot to protect against hepatitis B (hepatitis B vaccine).  - You have HIV or AIDS.  - You use needles to inject street drugs.  - You live with, or have sex with, someone who has Hepatitis B.  - You get hemodialysis treatment.  - You take certain medicines for conditions like cancer, organ transplantation, and autoimmune conditions.   Hepatitis C blood testing is recommended for all people born from [birth year] through 1965 and any individual with known risks for hepatitis C.   Practice safe sex. Use condoms and avoid high-risk sexual practices to reduce the spread of sexually transmitted infections (STIs). STIs include gonorrhea, chlamydia, syphilis, trichomonas, herpes, HPV, and human immunodeficiency virus (HIV). Herpes, HIV, and HPV are viral illnesses that have no cure. They can result in disability, cancer, and death. Sexually active women aged 26 years and younger should be checked for chlamydia. Older women with new or multiple partners should also be tested for chlamydia. Testing for other STIs is recommended if you are sexually active and at increased risk.   Osteoporosis is a disease in which the bones lose minerals and strength with aging. This can result in serious bone fractures or breaks. The risk of osteoporosis can be identified using a bone density scan. Women ages 59 years and over and women at risk for fractures or osteoporosis should discuss screening with their health care providers. Ask your health care provider whether you should take a calcium supplement or vitamin D to There are also several preventive steps women can take to avoid osteoporosis and resulting fractures or to keep osteoporosis from worsening. -->Recommendations include:  Eat a balanced diet high in fruits, vegetables, calcium, and  vitamins.  Get enough calcium. The recommended total intake of is 1,200 mg daily; for best absorption, if taking supplements, divide doses into 250-500 mg doses throughout the day. Of the two types of calcium, calcium carbonate is best absorbed when taken with food but calcium citrate can be taken on an empty stomach.  Get enough vitamin D. NAMS and the [place] recommend at least 1,000 IU per day for women age 46 and over who are at risk of vitamin D deficiency. Vitamin D deficiency can be caused by inadequate sun exposure (for example, those who live in [place]).  Avoid alcohol and smoking. Heavy alcohol intake (more than 7 drinks per week) increases the risk of falls and hip fracture and women smokers tend to lose bone more rapidly and have lower bone mass than nonsmokers. Stopping smoking is one of the most important changes women can make to improve their health and decrease risk for disease.  Be physically active every day. Weight-bearing exercise (for example, fast walking, hiking, jogging, and weight training) may strengthen bones or slow the rate of bone loss that comes with aging. Balancing and muscle-strengthening exercises can reduce the risk of falling and fracture.  Consider therapeutic medications. Currently, several types of effective drugs are available. Healthcare providers can recommend the type most appropriate for each woman.  Eliminate environmental factors that may contribute to accidents. Falls cause nearly 90% of all osteoporotic fractures, so reducing this risk is an important bone-health strategy. Measures include ample lighting, removing obstructions to walking, using nonskid rugs on floors, and placing mats and/or grab bars in showers.  Be aware of medication side effects. Some common medicines make bones weaker. These include a type  of steroid drug called glucocorticoids used for arthritis and asthma, some antiseizure drugs, certain  sleeping pills, treatments for endometriosis, and some cancer drugs. An overactive thyroid gland or using too much thyroid hormone for an underactive thyroid can also be a problem. If you are taking these medicines, talk to your doctor about what you can do to help protect your bones.reduce the rate of osteoporosis.    Menopause can be associated with physical symptoms and risks. Hormone replacement therapy is available to decrease symptoms and risks. You should talk to your health care provider about whether hormone replacement therapy is right for you.   Use sunscreen. Apply sunscreen liberally and repeatedly throughout the day. You should seek shade when your shadow is shorter than you. Protect yourself by wearing long sleeves, pants, a wide-brimmed hat, and sunglasses year round, whenever you are outdoors.   Once a month, do a whole body skin exam, using a mirror to look at the skin on your back. Tell your health care provider of new moles, moles that have irregular borders, moles that are larger than a pencil eraser, or moles that have changed in shape or color.   -Stay current with required vaccines (immunizations).   Influenza vaccine. All adults should be immunized every year.  Tetanus, diphtheria, and acellular pertussis (Td, Tdap) vaccine. Pregnant women should receive 1 dose of Tdap vaccine during each pregnancy. The dose should be obtained regardless of the length of time since the last dose. Immunization is preferred during the 27th 36th week of gestation. An adult who has not previously received Tdap or who does not know her vaccine status should receive 1 dose of Tdap. This initial dose should be followed by tetanus and diphtheria toxoids (Td) booster doses every 10 years. Adults with an unknown or incomplete history of completing a 3-dose immunization series with Td-containing vaccines should begin or complete a primary immunization series including a Tdap dose. Adults should  receive a Td booster every 10 years.  Varicella vaccine. An adult without evidence of immunity to varicella should receive 2 doses or a second dose if she has previously received 1 dose. Pregnant females who do not have evidence of immunity should receive the first dose after pregnancy. This first dose should be obtained before leaving the health care facility. The second dose should be obtained 4 8 weeks after the first dose.  Human papillomavirus (HPV) vaccine. Females aged 52 26 years who have not received the vaccine previously should obtain the 3-dose series. The vaccine is not recommended for use in pregnant females. However, pregnancy testing is not needed before receiving a dose. If a female is found to be pregnant after receiving a dose, no treatment is needed. In that case, the remaining doses should be delayed until after the pregnancy. Immunization is recommended for any person with an immunocompromised condition through the age of 57 years if she did not get any or all doses earlier. During the 3-dose series, the second dose should be obtained 4 8 weeks after the first dose. The third dose should be obtained 24 weeks after the first dose and 16 weeks after the second dose.  Zoster vaccine. One dose is recommended for adults aged 15 years or older unless certain conditions are present.  Measles, mumps, and rubella (MMR) vaccine. Adults born before 26 generally are considered immune to measles and mumps. Adults born in [birth year] or later should have 1 or more doses of MMR vaccine unless there is a contraindication to  the vaccine or there is laboratory evidence of immunity to each of the three diseases. A routine second dose of MMR vaccine should be obtained at least 28 days after the first dose for students attending postsecondary schools, health care workers, or international travelers. People who received inactivated measles vaccine or an unknown type of measles vaccine during 1963 1967 should  receive 2 doses of MMR vaccine. People who received inactivated mumps vaccine or an unknown type of mumps vaccine before 1979 and are at high risk for mumps infection should consider immunization with 2 doses of MMR vaccine. For females of childbearing age, rubella immunity should be determined. If there is no evidence of immunity, females who are not pregnant should be vaccinated. If there is no evidence of immunity, females who are pregnant should delay immunization until after pregnancy. Unvaccinated health care workers born before [birth year] who lack laboratory evidence of measles, mumps, or rubella immunity or laboratory confirmation of disease should consider measles and mumps immunization with 2 doses of MMR vaccine or rubella immunization with 1 dose of MMR vaccine.  Pneumococcal 13-valent conjugate (PCV13) vaccine. When indicated, a person who is uncertain of her immunization history and has no record of immunization should receive the PCV13 vaccine. An adult aged 41 years or older who has certain medical conditions and has not been previously immunized should receive 1 dose of PCV13 vaccine. This PCV13 should be followed with a dose of pneumococcal polysaccharide (PPSV23) vaccine. The PPSV23 vaccine dose should be obtained at least 8 weeks after the dose of PCV13 vaccine. An adult aged 2 years or older who has certain medical conditions and previously received 1 or more doses of PPSV23 vaccine should receive 1 dose of PCV13. The PCV13 vaccine dose should be obtained 1 or more years after the last PPSV23 vaccine dose.  Pneumococcal polysaccharide (PPSV23) vaccine. When PCV13 is also indicated, PCV13 should be obtained first. All adults aged 24 years and older should be immunized. An adult younger than age 47 years who has certain medical conditions should be immunized. Any person who resides in a nursing home or long-term care facility should be immunized. An adult smoker should be immunized. People with an  immunocompromised condition and certain other conditions should receive both PCV13 and PPSV23 vaccines. People with human immunodeficiency virus (HIV) infection should be immunized as soon as possible after diagnosis. Immunization during chemotherapy or radiation therapy should be avoided. Routine use of PPSV23 vaccine is not recommended for American Indians, [place] Natives, or people younger than 65 years unless there are medical conditions that require PPSV23 vaccine. When indicated, people who have unknown immunization and have no record of immunization should receive PPSV23 vaccine. One-time revaccination 5 years after the first dose of PPSV23 is recommended for people aged 17 64 years who have chronic kidney failure, nephrotic syndrome, asplenia, or immunocompromised conditions. People who received 1 2 doses of PPSV23 before age 34 years should receive another dose of PPSV23 vaccine at age 24 years or later if at least 5 years have passed since the previous dose. Doses of PPSV23 are not needed for people immunized with PPSV23 at or after age 89 years.  Meningococcal vaccine. Adults with asplenia or persistent complement component deficiencies should receive 2 doses of quadrivalent meningococcal conjugate (MenACWY-D) vaccine. The doses should be obtained at least 2 months apart. Microbiologists working with certain meningococcal bacteria, [place] recruits, people at risk during an outbreak, and people who travel to or live in countries with a high  rate of meningitis should be immunized. A first-year college student up through age 69 years who is living in a residence hall should receive a dose if she did not receive a dose on or after her 16th birthday. Adults who have certain high-risk conditions should receive one or more doses of vaccine.  Hepatitis A vaccine. Adults who wish to be protected from this disease, have certain high-risk conditions, work with hepatitis A-infected animals, work in hepatitis A  research labs, or travel to or work in countries with a high rate of hepatitis A should be immunized. Adults who were previously unvaccinated and who anticipate close contact with an international adoptee during the first 60 days after arrival in the [place] from a country with a high rate of hepatitis A should be immunized.  Hepatitis B vaccine.  Adults who wish to be protected from this disease, have certain high-risk conditions, may be exposed to blood or other infectious body fluids, are household contacts or sex partners of hepatitis B positive people, are clients or workers in certain care facilities, or travel to or work in countries with a high rate of hepatitis B should be immunized.  Haemophilus influenzae type b (Hib) vaccine. A previously unvaccinated person with asplenia or sickle cell disease or having a scheduled splenectomy should receive 1 dose of Hib vaccine. Regardless of previous immunization, a recipient of a hematopoietic stem cell transplant should receive a 3-dose series 6 12 months after her successful transplant. Hib vaccine is not recommended for adults with HIV infection.  Preventive Services / Frequency Ages 55 to 39years  Blood pressure check.** / Every 1 to 2 years.  Lipid and cholesterol check.** / Every 5 years beginning at age 40.  Clinical breast exam.** / Every 3 years for women in their 36s and 8s.  BRCA-related cancer risk assessment.** / For women who have family members with a BRCA-related cancer (breast, ovarian, tubal, or peritoneal cancers).  Pap test.** / Every 2 years from ages 33 through 27. Every 3 years starting at age 65 through age 55 or 7 with a history of 3 consecutive normal Pap tests.  HPV screening.** / Every 3 years from ages 53 through ages 2 to 14 with a history of 3 consecutive normal Pap tests.  Hepatitis C blood test.** / For any individual with known risks for hepatitis C.  Skin self-exam. / Monthly.  Influenza vaccine. /  Every year.  Tetanus, diphtheria, and acellular pertussis (Tdap, Td) vaccine.** / Consult your health care provider. Pregnant women should receive 1 dose of Tdap vaccine during each pregnancy. 1 dose of Td every 10 years.  Varicella vaccine.** / Consult your health care provider. Pregnant females who do not have evidence of immunity should receive the first dose after pregnancy.  HPV vaccine. / 3 doses over 6 months, if 64 and younger. The vaccine is not recommended for use in pregnant females. However, pregnancy testing is not needed before receiving a dose.  Measles, mumps, rubella (MMR) vaccine.** / You need at least 1 dose of MMR if you were born in 1957 or later. You may also need a 2nd dose. For females of childbearing age, rubella immunity should be determined. If there is no evidence of immunity, females who are not pregnant should be vaccinated. If there is no evidence of immunity, females who are pregnant should delay immunization until after pregnancy.  Pneumococcal 13-valent conjugate (PCV13) vaccine.** / Consult your health care provider.  Pneumococcal polysaccharide (PPSV23) vaccine.** / 1  to 2 doses if you smoke cigarettes or if you have certain conditions.  Meningococcal vaccine.** / 1 dose if you are age 19 to 8 years and a Market researcher living in a residence hall, or have one of several medical conditions, you need to get vaccinated against meningococcal disease. You may also need additional booster doses.  Hepatitis A vaccine.** / Consult your health care provider.  Hepatitis B vaccine.** / Consult your health care provider.  Haemophilus influenzae type b (Hib) vaccine.** / Consult your health care provider.  Ages 3 to 64years  Blood pressure check.** / Every 1 to 2 years.  Lipid and cholesterol check.** / Every 5 years beginning at age 28 years.  Lung cancer screening. / Every year if you are aged 74 80 years and have a 30-pack-year history of smoking  and currently smoke or have quit within the past 15 years. Yearly screening is stopped once you have quit smoking for at least 15 years or develop a health problem that would prevent you from having lung cancer treatment.  Clinical breast exam.** / Every year after age 78 years.  BRCA-related cancer risk assessment.** / For women who have family members with a BRCA-related cancer (breast, ovarian, tubal, or peritoneal cancers).  Mammogram.** / Every year beginning at age 57 years and continuing for as long as you are in good health. Consult with your health care provider.  Pap test.** / Every 3 years starting at age 31 years through age 21 or 58 years with a history of 3 consecutive normal Pap tests.  HPV screening.** / Every 3 years from ages 74 years through ages 84 to 20 years with a history of 3 consecutive normal Pap tests.  Fecal occult blood test (FOBT) of stool. / Every year beginning at age 65 years and continuing until age 19 years. You may not need to do this test if you get a colonoscopy every 10 years.  Flexible sigmoidoscopy or colonoscopy.** / Every 5 years for a flexible sigmoidoscopy or every 10 years for a colonoscopy beginning at age 86 years and continuing until age 4 years.  Hepatitis C blood test.** / For all people born from [birth year] through 1965 and any individual with known risks for hepatitis C.  Skin self-exam. / Monthly.  Influenza vaccine. / Every year.  Tetanus, diphtheria, and acellular pertussis (Tdap/Td) vaccine.** / Consult your health care provider. Pregnant women should receive 1 dose of Tdap vaccine during each pregnancy. 1 dose of Td every 10 years.  Varicella vaccine.** / Consult your health care provider. Pregnant females who do not have evidence of immunity should receive the first dose after pregnancy.  Zoster vaccine.** / 1 dose for adults aged 56 years or older.  Measles, mumps, rubella (MMR) vaccine.** / You need at least 1 dose of MMR if you  were born in 1957 or later. You may also need a 2nd dose. For females of childbearing age, rubella immunity should be determined. If there is no evidence of immunity, females who are not pregnant should be vaccinated. If there is no evidence of immunity, females who are pregnant should delay immunization until after pregnancy.  Pneumococcal 13-valent conjugate (PCV13) vaccine.** / Consult your health care provider.  Pneumococcal polysaccharide (PPSV23) vaccine.** / 1 to 2 doses if you smoke cigarettes or if you have certain conditions.  Meningococcal vaccine.** / Consult your health care provider.  Hepatitis A vaccine.** / Consult your health care provider.  Hepatitis B vaccine.** / Consult your  health care provider.  Haemophilus influenzae type b (Hib) vaccine.** / Consult your health care provider.  Ages 28 years and over  Blood pressure check.** / Every 1 to 2 years.  Lipid and cholesterol check.** / Every 5 years beginning at age 64 years.  Lung cancer screening. / Every year if you are aged 46 80 years and have a 30-pack-year history of smoking and currently smoke or have quit within the past 15 years. Yearly screening is stopped once you have quit smoking for at least 15 years or develop a health problem that would prevent you from having lung cancer treatment.  Clinical breast exam.** / Every year after age 19 years.  BRCA-related cancer risk assessment.** / For women who have family members with a BRCA-related cancer (breast, ovarian, tubal, or peritoneal cancers).  Mammogram.** / Every year beginning at age 84 years and continuing for as long as you are in good health. Consult with your health care provider.  Pap test.** / Every 3 years starting at age 54 years through age 67 or 42 years with 3 consecutive normal Pap tests. Testing can be stopped between 65 and 70 years with 3 consecutive normal Pap tests and no abnormal Pap or HPV tests in the past 10 years.  HPV screening.**  / Every 3 years from ages 39 years through ages 30 or 36 years with a history of 3 consecutive normal Pap tests. Testing can be stopped between 65 and 70 years with 3 consecutive normal Pap tests and no abnormal Pap or HPV tests in the past 10 years.  Fecal occult blood test (FOBT) of stool. / Every year beginning at age 37 years and continuing until age 17 years. You may not need to do this test if you get a colonoscopy every 10 years.  Flexible sigmoidoscopy or colonoscopy.** / Every 5 years for a flexible sigmoidoscopy or every 10 years for a colonoscopy beginning at age 82 years and continuing until age 85 years.  Hepatitis C blood test.** / For all people born from [birth year] through 1965 and any individual with known risks for hepatitis C.  Osteoporosis screening.** / A one-time screening for women ages 14 years and over and women at risk for fractures or osteoporosis.  Skin self-exam. / Monthly.  Influenza vaccine. / Every year.  Tetanus, diphtheria, and acellular pertussis (Tdap/Td) vaccine.** / 1 dose of Td every 10 years.  Varicella vaccine.** / Consult your health care provider.  Zoster vaccine.** / 1 dose for adults aged 53 years or older.  Pneumococcal 13-valent conjugate (PCV13) vaccine.** / Consult your health care provider.  Pneumococcal polysaccharide (PPSV23) vaccine.** / 1 dose for all adults aged 83 years and older.  Meningococcal vaccine.** / Consult your health care provider.  Hepatitis A vaccine.** / Consult your health care provider.  Hepatitis B vaccine.** / Consult your health care provider.  Haemophilus influenzae type b (Hib) vaccine.** / Consult your health care provider. ** Family history and personal history of risk and conditions may change your health care provider's recommendations. Document Released: 07/04/2001 Document Revised: 02/26/2013  [facility] Patient Information 2014 [place], [place].   EXERCISE AND DIET:  We recommended that you start or  continue a regular exercise program for good health. Regular exercise means any activity that makes your heart beat faster and makes you sweat.  We recommend exercising at least 30 minutes per day at least 3 days a week, preferably 5.  We also recommend a diet low in fat and sugar /  carbohydrates.  Inactivity, poor dietary choices and obesity can cause diabetes, heart attack, stroke, and kidney damage, among others.     ALCOHOL AND SMOKING:  Women should limit their alcohol intake to no more than 7 drinks/beers/glasses of wine (combined, not each!) per week. Moderation of alcohol intake to this level decreases your risk of breast cancer and liver damage.  ( And of course, no recreational drugs are part of a healthy lifestyle.)  Also, you should not be smoking at all or even being exposed to second hand smoke. Most people know smoking can cause cancer, and various heart and lung diseases, but did you know it also contributes to weakening of your bones?  Aging of your skin?  Yellowing of your teeth and nails?   CALCIUM AND VITAMIN D:  Adequate intake of calcium and Vitamin D are recommended.  The recommendations for exact amounts of these supplements seem to change often, but generally speaking 600 mg of calcium (either carbonate or citrate) and 800 units of Vitamin D per day seems prudent. Certain women may benefit from higher intake of Vitamin D.  If you are among these women, your doctor will have told you during your visit.     PAP SMEARS:  Pap smears, to check for cervical cancer or precancers,  have traditionally been done yearly, although recent scientific advances have shown that most women can have pap smears less often.  However, every woman still should have a physical exam from her gynecologist or primary care physician every year. It will include a breast check, inspection of the vulva and vagina to check for abnormal growths or skin changes, a visual exam of the cervix, and then an exam to  evaluate the size and shape of the uterus and ovaries.  And after 52 years of age, a rectal exam is indicated to check for rectal cancers. We will also provide age appropriate advice regarding health maintenance, like when you should have certain vaccines, screening for sexually transmitted diseases, bone density testing, colonoscopy, mammograms, etc.    MAMMOGRAMS:  All women over 36 years old should have a yearly mammogram. Many facilities now offer a "3D" mammogram, which may cost around $50 extra out of pocket. If possible,  we recommend you accept the option to have the 3D mammogram performed.  It both reduces the number of women who will be called back for extra views which then turn out to be normal, and it is better than the routine mammogram at detecting truly abnormal areas.     COLONOSCOPY:  Colonoscopy to screen for colon cancer is recommended for all women at age 40.  We know, you hate the idea of the prep.  We agree, BUT, having colon cancer and not knowing it is worse!!  Colon cancer so often starts as a polyp that can be seen and removed at colonscopy, which can quite literally save your life!  And if your first colonoscopy is normal and you have no family history of colon cancer, most women don't have to have it again for 10 years.  Once every ten years, you can do something that may end up saving your life, right?  We will be happy to help you get it scheduled when you are ready.  Be sure to check your insurance coverage so you understand how much it will cost.  It may be covered as a preventative service at no cost, but you should check your particular policy.

## 2018-03-11 ENCOUNTER — Other Ambulatory Visit: Payer: BLUE CROSS/BLUE SHIELD

## 2018-03-20 DIAGNOSIS — Z1283 Encounter for screening for malignant neoplasm of skin: Secondary | ICD-10-CM | POA: Diagnosis not present

## 2018-03-20 DIAGNOSIS — L570 Actinic keratosis: Secondary | ICD-10-CM | POA: Diagnosis not present

## 2018-03-20 DIAGNOSIS — X32XXXA Exposure to sunlight, initial encounter: Secondary | ICD-10-CM | POA: Diagnosis not present

## 2018-03-20 DIAGNOSIS — L821 Other seborrheic keratosis: Secondary | ICD-10-CM | POA: Diagnosis not present

## 2018-04-03 ENCOUNTER — Encounter: Payer: Self-pay | Admitting: Family Medicine

## 2018-04-03 ENCOUNTER — Other Ambulatory Visit: Payer: BLUE CROSS/BLUE SHIELD

## 2018-04-05 ENCOUNTER — Other Ambulatory Visit: Payer: Self-pay

## 2018-04-05 DIAGNOSIS — R7989 Other specified abnormal findings of blood chemistry: Secondary | ICD-10-CM

## 2018-04-05 DIAGNOSIS — Z0000 Encounter for general adult medical examination without abnormal findings: Secondary | ICD-10-CM

## 2018-04-05 DIAGNOSIS — E559 Vitamin D deficiency, unspecified: Secondary | ICD-10-CM

## 2018-04-05 DIAGNOSIS — E7800 Pure hypercholesterolemia, unspecified: Secondary | ICD-10-CM

## 2018-04-05 DIAGNOSIS — Z86010 Personal history of colonic polyps: Secondary | ICD-10-CM

## 2018-04-05 NOTE — Addendum Note (Signed)
Addended by: Leda MinPULLIAM, Deane Melick D on: 04/05/2018 01:03 PM   Modules accepted: Orders

## 2018-04-11 DIAGNOSIS — R7989 Other specified abnormal findings of blood chemistry: Secondary | ICD-10-CM | POA: Diagnosis not present

## 2018-04-11 DIAGNOSIS — Z0000 Encounter for general adult medical examination without abnormal findings: Secondary | ICD-10-CM | POA: Diagnosis not present

## 2018-04-11 DIAGNOSIS — E559 Vitamin D deficiency, unspecified: Secondary | ICD-10-CM | POA: Diagnosis not present

## 2018-04-11 DIAGNOSIS — E7800 Pure hypercholesterolemia, unspecified: Secondary | ICD-10-CM | POA: Diagnosis not present

## 2018-04-12 LAB — COMPREHENSIVE METABOLIC PANEL
AG Ratio: 1.7 (calc) (ref 1.0–2.5)
ALT: 12 U/L (ref 6–29)
AST: 18 U/L (ref 10–35)
Albumin: 4.3 g/dL (ref 3.6–5.1)
Alkaline phosphatase (APISO): 49 U/L (ref 33–130)
BILIRUBIN TOTAL: 0.4 mg/dL (ref 0.2–1.2)
BUN: 13 mg/dL (ref 7–25)
CALCIUM: 9.2 mg/dL (ref 8.6–10.4)
CO2: 28 mmol/L (ref 20–32)
Chloride: 106 mmol/L (ref 98–110)
Creat: 0.81 mg/dL (ref 0.50–1.05)
Globulin: 2.5 g/dL (calc) (ref 1.9–3.7)
Glucose, Bld: 80 mg/dL (ref 65–99)
Potassium: 4.2 mmol/L (ref 3.5–5.3)
SODIUM: 142 mmol/L (ref 135–146)
Total Protein: 6.8 g/dL (ref 6.1–8.1)

## 2018-04-12 LAB — CBC WITH DIFFERENTIAL/PLATELET
Basophils Absolute: 19 cells/uL (ref 0–200)
Basophils Relative: 0.4 %
EOS PCT: 2.4 %
Eosinophils Absolute: 113 cells/uL (ref 15–500)
HEMATOCRIT: 37.1 % (ref 35.0–45.0)
HEMOGLOBIN: 12.6 g/dL (ref 11.7–15.5)
LYMPHS ABS: 1302 {cells}/uL (ref 850–3900)
MCH: 29.3 pg (ref 27.0–33.0)
MCHC: 34 g/dL (ref 32.0–36.0)
MCV: 86.3 fL (ref 80.0–100.0)
MPV: 11.3 fL (ref 7.5–12.5)
Monocytes Relative: 8 %
NEUTROS ABS: 2891 {cells}/uL (ref 1500–7800)
Neutrophils Relative %: 61.5 %
Platelets: 301 10*3/uL (ref 140–400)
RBC: 4.3 10*6/uL (ref 3.80–5.10)
RDW: 12.3 % (ref 11.0–15.0)
Total Lymphocyte: 27.7 %
WBC: 4.7 10*3/uL (ref 3.8–10.8)
WBCMIX: 376 {cells}/uL (ref 200–950)

## 2018-04-12 LAB — HEMOGLOBIN A1C
EAG (MMOL/L): 6.3 (calc)
Hgb A1c MFr Bld: 5.6 % of total Hgb (ref <5.7)
MEAN PLASMA GLUCOSE: 114 (calc)

## 2018-04-12 LAB — LIPID PANEL
Cholesterol: 229 mg/dL — ABNORMAL HIGH (ref <200)
HDL: 78 mg/dL (ref >50)
LDL Cholesterol (Calc): 128 mg/dL (calc) — ABNORMAL HIGH
NON-HDL CHOLESTEROL (CALC): 151 mg/dL — AB (ref <130)
Total CHOL/HDL Ratio: 2.9 (calc) (ref <5.0)
Triglycerides: 124 mg/dL (ref <150)

## 2018-04-12 LAB — TSH: TSH: 0.81 mIU/L

## 2018-04-12 LAB — VITAMIN D 25 HYDROXY (VIT D DEFICIENCY, FRACTURES): Vit D, 25-Hydroxy: 19 ng/mL — ABNORMAL LOW (ref 30–100)

## 2018-04-12 LAB — T4, FREE: Free T4: 1.1 ng/dL (ref 0.8–1.8)

## 2018-04-23 DIAGNOSIS — G542 Cervical root disorders, not elsewhere classified: Secondary | ICD-10-CM | POA: Diagnosis not present

## 2018-04-23 DIAGNOSIS — M5412 Radiculopathy, cervical region: Secondary | ICD-10-CM | POA: Insufficient documentation

## 2018-04-23 DIAGNOSIS — M542 Cervicalgia: Secondary | ICD-10-CM | POA: Diagnosis not present

## 2018-04-24 DIAGNOSIS — G542 Cervical root disorders, not elsewhere classified: Secondary | ICD-10-CM | POA: Diagnosis not present

## 2018-05-07 DIAGNOSIS — M5030 Other cervical disc degeneration, unspecified cervical region: Secondary | ICD-10-CM | POA: Diagnosis not present

## 2018-06-12 DIAGNOSIS — M25531 Pain in right wrist: Secondary | ICD-10-CM | POA: Diagnosis not present

## 2018-07-30 DIAGNOSIS — L600 Ingrowing nail: Secondary | ICD-10-CM | POA: Diagnosis not present

## 2018-08-12 DIAGNOSIS — M25531 Pain in right wrist: Secondary | ICD-10-CM | POA: Diagnosis not present

## 2018-08-14 DIAGNOSIS — Z01419 Encounter for gynecological examination (general) (routine) without abnormal findings: Secondary | ICD-10-CM | POA: Diagnosis not present

## 2018-08-14 DIAGNOSIS — Z1231 Encounter for screening mammogram for malignant neoplasm of breast: Secondary | ICD-10-CM | POA: Diagnosis not present

## 2018-08-14 DIAGNOSIS — Z6827 Body mass index (BMI) 27.0-27.9, adult: Secondary | ICD-10-CM | POA: Diagnosis not present

## 2018-08-14 DIAGNOSIS — R87612 Low grade squamous intraepithelial lesion on cytologic smear of cervix (LGSIL): Secondary | ICD-10-CM | POA: Diagnosis not present

## 2018-09-03 DIAGNOSIS — M5033 Other cervical disc degeneration, cervicothoracic region: Secondary | ICD-10-CM | POA: Diagnosis not present

## 2018-09-04 DIAGNOSIS — M25531 Pain in right wrist: Secondary | ICD-10-CM | POA: Diagnosis not present

## 2018-10-10 ENCOUNTER — Encounter: Payer: Self-pay | Admitting: Family Medicine

## 2018-10-18 ENCOUNTER — Encounter: Payer: Self-pay | Admitting: Family Medicine

## 2018-12-02 DIAGNOSIS — M25511 Pain in right shoulder: Secondary | ICD-10-CM | POA: Diagnosis not present

## 2018-12-02 DIAGNOSIS — Z20828 Contact with and (suspected) exposure to other viral communicable diseases: Secondary | ICD-10-CM | POA: Diagnosis not present

## 2019-01-16 DIAGNOSIS — M1612 Unilateral primary osteoarthritis, left hip: Secondary | ICD-10-CM | POA: Diagnosis not present

## 2019-01-16 DIAGNOSIS — Z9889 Other specified postprocedural states: Secondary | ICD-10-CM | POA: Diagnosis not present

## 2019-01-16 DIAGNOSIS — M5136 Other intervertebral disc degeneration, lumbar region: Secondary | ICD-10-CM | POA: Diagnosis not present

## 2019-01-16 DIAGNOSIS — M961 Postlaminectomy syndrome, not elsewhere classified: Secondary | ICD-10-CM | POA: Diagnosis not present

## 2019-01-16 DIAGNOSIS — M419 Scoliosis, unspecified: Secondary | ICD-10-CM | POA: Insufficient documentation

## 2019-02-06 DIAGNOSIS — M25531 Pain in right wrist: Secondary | ICD-10-CM | POA: Diagnosis not present

## 2019-02-17 ENCOUNTER — Encounter: Payer: Self-pay | Admitting: Family Medicine

## 2019-02-17 ENCOUNTER — Other Ambulatory Visit: Payer: Self-pay

## 2019-02-17 DIAGNOSIS — Z20822 Contact with and (suspected) exposure to covid-19: Secondary | ICD-10-CM

## 2019-02-17 DIAGNOSIS — R6889 Other general symptoms and signs: Secondary | ICD-10-CM | POA: Diagnosis not present

## 2019-02-18 LAB — NOVEL CORONAVIRUS, NAA: SARS-CoV-2, NAA: NOT DETECTED

## 2019-03-24 DIAGNOSIS — M25531 Pain in right wrist: Secondary | ICD-10-CM | POA: Diagnosis not present

## 2019-05-07 DIAGNOSIS — M25531 Pain in right wrist: Secondary | ICD-10-CM | POA: Diagnosis not present

## 2019-05-22 DIAGNOSIS — L600 Ingrowing nail: Secondary | ICD-10-CM | POA: Diagnosis not present

## 2019-08-12 ENCOUNTER — Encounter: Payer: Self-pay | Admitting: Family Medicine

## 2019-08-12 ENCOUNTER — Ambulatory Visit (INDEPENDENT_AMBULATORY_CARE_PROVIDER_SITE_OTHER): Payer: Self-pay | Admitting: Family Medicine

## 2019-08-12 ENCOUNTER — Other Ambulatory Visit: Payer: Self-pay

## 2019-08-12 VITALS — BP 120/78 | HR 72 | Temp 97.6°F | Resp 10 | Ht 66.0 in | Wt 158.1 lb

## 2019-08-12 DIAGNOSIS — E7800 Pure hypercholesterolemia, unspecified: Secondary | ICD-10-CM

## 2019-08-12 DIAGNOSIS — Z0000 Encounter for general adult medical examination without abnormal findings: Secondary | ICD-10-CM

## 2019-08-12 DIAGNOSIS — Z9189 Other specified personal risk factors, not elsewhere classified: Secondary | ICD-10-CM

## 2019-08-12 DIAGNOSIS — R7989 Other specified abnormal findings of blood chemistry: Secondary | ICD-10-CM

## 2019-08-12 DIAGNOSIS — E559 Vitamin D deficiency, unspecified: Secondary | ICD-10-CM

## 2019-08-12 DIAGNOSIS — Z719 Counseling, unspecified: Secondary | ICD-10-CM

## 2019-08-12 DIAGNOSIS — Z1159 Encounter for screening for other viral diseases: Secondary | ICD-10-CM

## 2019-08-12 NOTE — Progress Notes (Signed)
Female Physical  Impression and Recommendations:    1. Encounter for wellness examination   2. Health education/counseling   3. High serum HDL  ( 65 in 7/18)   4. Vitamin D insufficiency-  was 25.5 in 7/18   5. Elevated LDL cholesterol level-  107 in 7/18   6. Encounter for hepatitis C virus screening test for high risk patient      - Patient was last seen at clinic here 02/18/2018.   1) Anticipatory Guidance: Discussed skin CA prevention and sunscreen when outside along with skin surveillance; eating a balanced and modest diet; physical activity at least 25 minutes per day or minimum of 150 min/ week moderate to intense activity.   2) Immunizations / Screenings / Labs:   All immunizations are up-to-date per recommendations or will be updated today if pt allows.    - Patient understands with dental and vision screens they will schedule independently.  - Will obtain CBC, CMP, HgA1c, Lipid panel, TSH and vit D when fasting, if not already done past 12 mo/ recently   - Last colonoscopy 08/30/2017 with [name]. - Repeat colonoscopy 5 years since last check.  - Patient follows up with [name] of OBGYN. - Per patient, scheduled for appointment next week. - Patient will continue to follow up with OBGYN for female care.  - Last mammogram on record obtained 07/12/2017. - Encouraged patient to obtain updated mammogram yearly.  - Last pap smear on record 05/01/2016.   - Per patient, last pap smear obtained on 07/12/2017, same day as her last mammogram.    - Discussed indications for Hep C/HIV screen.   - Patient agrees to screening today.  - Shingles vaccine up to date. - Advised patient to wait 45 days from her last vaccination and obtaining the COVID-19 vaccine.  - Strongly encouraged patient to obtain her COVID-19 vaccine.   3) Weight, Health Counseling & Preventative Maintenance: - BMI meaning discussed with patient.   - Advised patient to avoid weight loss over  10-15 lbs, and instead work toward maintaining a HEALTHY BMI  - Discussed primary goal to improve dietary habits to improve overall feelings of well being and objective health data. Improve nutrient density of diet through increasing intake of fruits and vegetables and decreasing saturated fats, white flour products and refined sugars.  - Advised patient to continue working toward daily exercising to improve overall mental, physical, and emotional health.  Encouraged patient to focus on physical conditioning and fitness over weight loss.  - Encouraged patient to engage in daily physical activity as tolerated, especially a formal exercise routine.  Recommended that the patient eventually strive for at least 150 minutes of moderate cardiovascular activity per week according to guidelines established by the [facility].   - Reviewed the "spokes of the wheel" of wellbeing.  Stressed the importance of ongoing prudent habits, including regular exercise, appropriate sleep hygiene, healthful dietary habits, and prayer/meditation to relax.  - Healthy, low-inflammatory dietary habits encouraged, including low-carb, high antioxidant, and high amounts of lean protein in diet.  - Patient should also consume adequate amounts of water.  - Health counseling performed.  All questions answered.    Orders Placed This Encounter  Procedures  . CBC  . Comprehensive metabolic panel  . TSH  . T4, free  . Hemoglobin A1c  . Lipid panel  . VITAMIN D 25 Hydroxy (Vit-D Deficiency, Fractures)  . Hepatitis C antibody     Return for f/up 6-12  months, or sooner if needed.    Reminded pt important of f-up preventative CPE in 1 year.  Reminded pt again, this is in addition to any chronic care visits.    Gross side effects, risk and benefits, and alternatives of medications discussed with patient.  Patient is aware that all medications have potential side effects and we are unable to predict every side effect or drug-drug  interaction that may occur.  Expresses verbal understanding and consents to current therapy plan and treatment regimen.  F-up preventative CPE in 1 year- reminded pt again, this is in addition to any chronic care visits.    Please see orders placed and AVS handed out to patient at the end of our visit for further patient instructions/ counseling done pertaining to today's office visit.   This case required medical decision making of at least moderate complexity.  This document serves as a record of services personally performed by [name], DO. It was created on her behalf by [name], a trained medical scribe. The creation of this record is based on the scribe's personal observations and the provider's statements to them.    The above documentation from [name], medical scribe, has been reviewed by [name], D.O.       Subjective:    I, [name], am serving as Neurosurgeon for Deanna Barrett.   CPE HPI: Deanna Barrett is a 54 y.o. female who presents to [facility] today a yearly health maintenance exam.   Health Maintenance Summary  - Reviewed and updated, unless pt declines services.  Last Cologuard or Colonoscopy:  Last obtained colonoscopy 08/30/2017. Family history of Colon CA:  Yes; patient is high risk due to family history of colonic polyps and adenomatous polyp of colon.  Tobacco History Reviewed:  Y; quit smoking 05/14/2013, 0.5 ppd, for 12.5 pack years.  Alcohol and/or drug use:  No concerns; no use Exercise Habits:  Not meeting AHA guidelines.  Was walking 3-4 miles per day before COVID-19. Dental Home:  Goes every six months. Eye exams:  Had an eye exam last year and received contacts in March or April. Dermatology home:  Follows up with dermatologist [name]; goes once yearly.  Family history of melanoma in sister around age 32.  Female Health:  Follows up with OBGYN [name] and is scheduled for  an appointment next week. PAP Smear - last known results:  Last pap smear on record 05/01/2016.  Per patient, last pap smear obtained on 07/12/2017, same day as her last mammogram.  No h/o ABN. STD concerns:  None, monogamous. Birth control method:  N/A, post-menopausal. Menses regular:  N/A, post-menopausal. Lumps or breast concerns:  None reported. Breast Cancer Family History:   None reported.   Additional concerns beyond health maintenance issues:   Denies bowel concerns, aside from recently after she started a new diet.  Notes she started the [place] diet and is currently on her second day.  Patient states that she wants to lose 25 lbs and get off of the Neurontin, and says with her menopause, "I hurt all over."     Immunization History  Administered Date(s) Administered  . Influenza,inj,Quad PF,6+ Mos 01/21/2019  . Influenza,inj,quad, With Preservative 02/19/2017  . Tdap 02/18/2018  . Zoster Recombinat (Shingrix) 01/21/2019, 07/29/2019     Health Maintenance  Topic Date Due  . PAP SMEAR-Modifier  05/11/2019  . MAMMOGRAM  07/13/2019  . HIV Screening  11/28/2028 (Originally 03/06/1981)  .  COLONOSCOPY  08/31/2027  . TETANUS/TDAP  02/19/2028  . INFLUENZA VACCINE  Completed     Wt Readings from Last 3 Encounters:  08/12/19 158 lb 1.6 oz (71.7 kg)  02/18/18 165 lb 8 oz (75.1 kg)  12/20/16 154 lb 6.4 oz (70 kg)   BP Readings from Last 3 Encounters:  08/12/19 120/78  02/18/18 119/80  12/20/16 130/80   Pulse Readings from Last 3 Encounters:  08/12/19 72  02/18/18 71  12/20/16 70     Past Medical History:  Diagnosis Date  . Vitamin D deficiency       Past Surgical History:  Procedure Laterality Date  . ABDOMINAL HYSTERECTOMY     partial  . KNEE ARTHROSCOPY    . SPINE SURGERY        Family History  Problem Relation Age of Onset  . Cancer Father        lung  . Hypertension Father   . Breast cancer Paternal Grandmother       Social History    Substance and Sexual Activity  Drug Use No  ,   Social History   Substance and Sexual Activity  Alcohol Use Yes   Comment: occ.  ,   Social History   Tobacco Use  Smoking Status Former Smoker  . Packs/day: 0.50  . Years: 25.00  . Pack years: 12.50  . Quit date: 05/14/2013  . Years since quitting: 6.2  Smokeless Tobacco Former Neurosurgeon  ,   Social History   Substance and Sexual Activity  Sexual Activity Yes    Current Outpatient Medications on File Prior to Visit  Medication Sig Dispense Refill  . gabapentin (NEURONTIN) 300 MG capsule Take 900 mg by mouth at bedtime.    Marland Kitchen HYDROcodone-acetaminophen (NORCO/VICODIN) 5-325 MG tablet hydrocodone 5 mg-acetaminophen 325 mg tablet  TAKE 1 TABLET BY MOUTH EVERY 6 HOURS PRN FOR 5 DAYS     No current facility-administered medications on file prior to visit.    Allergies: Patient has no known allergies.  Review of Systems: General:   Denies fever, chills, unexplained weight loss.  Optho/Auditory:   Denies visual changes, blurred vision/LOV Respiratory:   Denies SOB, DOE more than baseline levels.   Cardiovascular:   Denies chest pain, palpitations, new onset peripheral edema  Gastrointestinal:   Denies nausea, vomiting, diarrhea.  Genitourinary: Denies dysuria, freq/ urgency, flank pain or discharge from genitals.  Endocrine:     Denies hot or cold intolerance, polyuria, polydipsia. Musculoskeletal:   Denies unexplained myalgias, joint swelling, unexplained arthralgias, gait problems.  Skin:  Denies rash, suspicious lesions Neurological:     Denies dizziness, unexplained weakness, numbness  Psychiatric/Behavioral:   Denies mood changes, suicidal or homicidal ideations, hallucinations    Objective:    Blood pressure 120/78, pulse 72, temperature 97.6 F (36.4 C), temperature source Oral, resp. rate 10, height 5\' 6"  (1.676 m), weight 158 lb 1.6 oz (71.7 kg), SpO2 100 %. Body mass index is 25.52 kg/m. General Appearance:     Alert, cooperative, no distress, appears stated age  Head:    Normocephalic, without obvious abnormality, atraumatic  Eyes:    PERRL, conjunctiva/corneas clear, EOM's intact, fundi    benign, both eyes  Ears:    Normal TM's and external ear canals, both ears  Nose:   Nares normal, septum midline, mucosa normal, no drainage    or sinus tenderness  Throat:   Lips w/o lesion, mucosa moist, and tongue normal; teeth and   gums normal  Neck:  Supple, symmetrical, trachea midline, no adenopathy;    thyroid:  no enlargement/tenderness/nodules; no carotid   bruit or JVD  Back:     Symmetric, no curvature, ROM normal, no CVA tenderness  Lungs:     Clear to auscultation bilaterally, respirations unlabored, no       Wh/ R/ R  Chest Wall:    No tenderness or gross deformity; normal excursion   Heart:    Regular rate and rhythm, S1 and S2 normal, no murmur, rub   or gallop  Breast Exam:    Deferred to OBGYN.  Abdomen:     Soft, non-tender, bowel sounds active all four quadrants, NO   G/R/R, no masses, no organomegaly  Genitalia:    Deferred to OBGYN.   Rectal:    Deferred at this time.  Extremities:   Extremities normal, atraumatic, no cyanosis or gross edema  Pulses:   2+ and symmetric all extremities  Skin:   Warm, dry, Skin color, texture, turgor normal, no obvious rashes or lesions Psych: No HI/SI, judgement and insight good, Euthymic mood. Full Affect.  Neurologic:   CNII-XII intact, normal strength, sensation and reflexes    Throughout

## 2019-08-12 NOTE — Patient Instructions (Signed)
Please realize, EXERCISE IS MEDICINE!  -  American Heart Association ( AHA) guidelines for exercise : If you are in good health, without any medical conditions, you should engage in 150-300 minutes of moderate intensity aerobic activity per week.  This means you should be huffing and puffing throughout your workout.   Engaging in regular exercise will improve brain function and memory, as well as improve mood, boost immune system and help with weight management.  As well as the other, more well-known effects of exercise such as decreasing blood sugar levels, decreasing blood pressure,  and decreasing bad cholesterol levels/ increasing good cholesterol levels.     -  The AHA strongly endorses consumption of a diet that contains a variety of foods from all the food categories with an emphasis on fruits and vegetables; fat-free and low-fat dairy products; cereal and grain products; legumes and nuts; and fish, poultry, and/or extra lean meats.    Excessive food intake, especially of foods high in saturated and trans fats, sugar, and salt, should be avoided.    Adequate water intake of roughly 1/2 of your weight in pounds, should equal the ounces of water per day you should drink.  So for instance, if you're 200 pounds, that would be 100 ounces of water per day.         Mediterranean Diet  Why follow it? Research shows. . Those who follow the Mediterranean diet have a reduced risk of heart disease  . The diet is associated with a reduced incidence of Parkinson's and Alzheimer's diseases . People following the diet may have longer life expectancies and lower rates of chronic diseases  . The Dietary Guidelines for Americans recommends the Mediterranean diet as an eating plan to promote health and prevent disease  What Is the Mediterranean Diet?  . Healthy eating plan based on typical foods and recipes of Mediterranean-style cooking . The diet is primarily a plant based diet; these foods should make up a  majority of meals   Starches - Plant based foods should make up a majority of meals - They are an important sources of vitamins, minerals, energy, antioxidants, and fiber - Choose whole grains, foods high in fiber and minimally processed items  - Typical grain sources include wheat, oats, barley, corn, brown rice, bulgar, farro, millet, polenta, couscous  - Various types of beans include chickpeas, lentils, fava beans, black beans, white beans   Fruits  Veggies - Large quantities of antioxidant rich fruits & veggies; 6 or more servings  - Vegetables can be eaten raw or lightly drizzled with oil and cooked  - Vegetables common to the traditional Mediterranean Diet include: artichokes, arugula, beets, broccoli, brussel sprouts, cabbage, carrots, celery, collard greens, cucumbers, eggplant, kale, leeks, lemons, lettuce, mushrooms, okra, onions, peas, peppers, potatoes, pumpkin, radishes, rutabaga, shallots, spinach, sweet potatoes, turnips, zucchini - Fruits common to the Mediterranean Diet include: apples, apricots, avocados, cherries, clementines, dates, figs, grapefruits, grapes, melons, nectarines, oranges, peaches, pears, pomegranates, strawberries, tangerines  Fats - Replace butter and margarine with healthy oils, such as olive oil, canola oil, and tahini  - Limit nuts to no more than a handful a day  - Nuts include walnuts, almonds, pecans, pistachios, pine nuts  - Limit or avoid candied, honey roasted or heavily salted nuts - Olives are central to the Mediterranean diet - can be eaten whole or used in a variety of dishes   Meats Protein - Limiting red meat: no more than a few times a month -   When eating red meat: choose lean cuts and keep the portion to the size of deck of cards - Eggs: approx. 0 to 4 times a week  - Fish and lean poultry: at least 2 a week  - Healthy protein sources include, chicken, turkey, lean beef, lamb - Increase intake of seafood such as tuna, salmon, trout,  mackerel, shrimp, scallops - Avoid or limit high fat processed meats such as sausage and bacon  Dairy - Include moderate amounts of low fat dairy products  - Focus on healthy dairy such as fat free yogurt, skim milk, low or reduced fat cheese - Limit dairy products higher in fat such as whole or 2% milk, cheese, ice cream  Alcohol - Moderate amounts of red wine is ok  - No more than 5 oz daily for women (all ages) and men older than age 65  - No more than 10 oz of wine daily for men younger than 65  Other - Limit sweets and other desserts  - Use herbs and spices instead of salt to flavor foods  - Herbs and spices common to the traditional Mediterranean Diet include: basil, bay leaves, chives, cloves, cumin, fennel, garlic, lavender, marjoram, mint, oregano, parsley, pepper, rosemary, sage, savory, sumac, tarragon, thyme   It's not just a diet, it's a lifestyle:  . The Mediterranean diet includes lifestyle factors typical of those in the region  . Foods, drinks and meals are best eaten with others and savored . Daily physical activity is important for overall good health . This could be strenuous exercise like running and aerobics . This could also be more leisurely activities such as walking, housework, yard-work, or taking the stairs . Moderation is the key; a balanced and healthy diet accommodates most foods and drinks . Consider portion sizes and frequency of consumption of certain foods   Meal Ideas & Options:  . Breakfast:  o Whole wheat toast or whole wheat English muffins with peanut butter & hard boiled egg o Steel cut oats topped with apples & cinnamon and skim milk  o Fresh fruit: banana, strawberries, melon, berries, peaches  o Smoothies: strawberries, bananas, greek yogurt, peanut butter o Low fat greek yogurt with blueberries and granola  o Egg white omelet with spinach and mushrooms o Breakfast couscous: whole wheat couscous, apricots, skim milk, cranberries  . Sandwiches:   o Hummus and grilled vegetables (peppers, zucchini, squash) on whole wheat bread   o Grilled chicken on whole wheat pita with lettuce, tomatoes, cucumbers or tzatziki  o Tuna salad on whole wheat bread: tuna salad made with greek yogurt, olives, red peppers, capers, green onions o Garlic rosemary lamb pita: lamb sauted with garlic, rosemary, salt & pepper; add lettuce, cucumber, greek yogurt to pita - flavor with lemon juice and black pepper  . Seafood:  o Mediterranean grilled salmon, seasoned with garlic, basil, parsley, lemon juice and black pepper o Shrimp, lemon, and spinach whole-grain pasta salad made with low fat greek yogurt  o Seared scallops with lemon orzo  o Seared tuna steaks seasoned salt, pepper, coriander topped with tomato mixture of olives, tomatoes, olive oil, minced garlic, parsley, green onions and cappers  . Meats:  o Herbed greek chicken salad with kalamata olives, cucumber, feta  o Red bell peppers stuffed with spinach, bulgur, lean ground beef (or lentils) & topped with feta   o Kebabs: skewers of chicken, tomatoes, onions, zucchini, squash  o Turkey burgers: made with red onions, mint, dill, lemon juice, feta   cheese topped with roasted red peppers . Vegetarian o Cucumber salad: cucumbers, artichoke hearts, celery, red onion, feta cheese, tossed in olive oil & lemon juice  o Hummus and whole grain pita points with a greek salad (lettuce, tomato, feta, olives, cucumbers, red onion) o Lentil soup with celery, carrots made with vegetable broth, garlic, salt and pepper  o Tabouli salad: parsley, bulgur, mint, scallions, cucumbers, tomato, radishes, lemon juice, olive oil, salt and pepper.     Preventive Care for Adults, Female  A healthy lifestyle and preventive care can promote health and wellness. Preventive health guidelines for women include the following key practices.   A routine yearly physical is a good way to check with your health care provider about  your health and preventive screening. It is a chance to share any concerns and updates on your health and to receive a thorough exam.   Visit your dentist for a routine exam and preventive care every 6 months. Brush your teeth twice a day and floss once a day. Good oral hygiene prevents tooth decay and gum disease.   The frequency of eye exams is based on your age, health, family medical history, use of contact lenses, and other factors. Follow your health care provider's recommendations for frequency of eye exams.   Eat a healthy diet. Foods like vegetables, fruits, whole grains, low-fat dairy products, and lean protein foods contain the nutrients you need without too many calories. Decrease your intake of foods high in solid fats, added sugars, and salt. Eat the right amount of calories for you.Get information about a proper diet from your health care provider, if necessary.   Regular physical exercise is one of the most important things you can do for your health. Most adults should get at least 150 minutes of moderate-intensity exercise (any activity that increases your heart rate and causes you to sweat) each week. In addition, most adults need muscle-strengthening exercises on 2 or more days a week.   Maintain a healthy weight. The body mass index (BMI) is a screening tool to identify possible weight problems. It provides an estimate of body fat based on height and weight. Your health care provider can find your BMI, and can help you achieve or maintain a healthy weight.For adults 20 years and older:   - A BMI below 18.5 is considered underweight.   - A BMI of 18.5 to 24.9 is normal.   - A BMI of 25 to 29.9 is considered overweight.   - A BMI of 30 and above is considered obese.   Maintain normal blood lipids and cholesterol levels by exercising and minimizing your intake of trans and saturated fats.  Eat a balanced diet with plenty of fruit and vegetables. Blood tests for lipids and  cholesterol should begin at age 20 and be repeated every 5 years minimum.  If your lipid or cholesterol levels are high, you are over 40, or you are at high risk for heart disease, you may need your cholesterol levels checked more frequently.Ongoing high lipid and cholesterol levels should be treated with medicines if diet and exercise are not working.   If you smoke, find out from your health care provider how to quit. If you do not use tobacco, do not start.   Lung cancer screening is recommended for adults aged 55-80 years who are at high risk for developing lung cancer because of a history of smoking. A yearly low-dose CT scan of the lungs is recommended for people who   have at least a 30-pack-year history of smoking and are a current smoker or have quit within the past 15 years. A pack year of smoking is smoking an average of 1 pack of cigarettes a day for 1 year (for example: 1 pack a day for 30 years or 2 packs a day for 15 years). Yearly screening should continue until the smoker has stopped smoking for at least 15 years. Yearly screening should be stopped for people who develop a health problem that would prevent them from having lung cancer treatment.   If you are pregnant, do not drink alcohol. If you are breastfeeding, be very cautious about drinking alcohol. If you are not pregnant and choose to drink alcohol, do not have more than 1 drink per day. One drink is considered to be 12 ounces (355 mL) of beer, 5 ounces (148 mL) of wine, or 1.5 ounces (44 mL) of liquor.   Avoid use of street drugs. Do not share needles with anyone. Ask for help if you need support or instructions about stopping the use of drugs.   High blood pressure causes heart disease and increases the risk of stroke. Your blood pressure should be checked at least yearly.  Ongoing high blood pressure should be treated with medicines if weight loss and exercise do not work.   If you are 55-79 years old, ask your health  care provider if you should take aspirin to prevent strokes.   Diabetes screening involves taking a blood sample to check your fasting blood sugar level. This should be done once every 3 years, after age 45, if you are within normal weight and without risk factors for diabetes. Testing should be considered at a younger age or be carried out more frequently if you are overweight and have at least 1 risk factor for diabetes.   Breast cancer screening is essential preventive care for women. You should practice "breast self-awareness."  This means understanding the normal appearance and feel of your breasts and may include breast self-examination.  Any changes detected, no matter how small, should be reported to a health care provider.  Women in their 20s and 30s should have a clinical breast exam (CBE) by a health care provider as part of a regular health exam every 1 to 3 years.  After age 40, women should have a CBE every year.  Starting at age 40, women should consider having a mammogram (breast X-ray test) every year.  Women who have a family history of breast cancer should talk to their health care provider about genetic screening.  Women at a high risk of breast cancer should talk to their health care providers about having an MRI and a mammogram every year.   -Breast cancer gene (BRCA)-related cancer risk assessment is recommended for women who have family members with BRCA-related cancers. BRCA-related cancers include breast, ovarian, tubal, and peritoneal cancers. Having family members with these cancers may be associated with an increased risk for harmful changes (mutations) in the breast cancer genes BRCA1 and BRCA2. Results of the assessment will determine the need for genetic counseling and BRCA1 and BRCA2 testing.   The Pap test is a screening test for cervical cancer. A Pap test can show cell changes on the cervix that might become cervical cancer if left untreated. A Pap test is a procedure  in which cells are obtained and examined from the lower end of the uterus (cervix).   - Women should have a Pap test starting at age   21.   - Between ages 21 and 29, Pap tests should be repeated every 2 years.   - Beginning at age 30, you should have a Pap test every 3 years as long as the past 3 Pap tests have been normal.   - Some women have medical problems that increase the chance of getting cervical cancer. Talk to your health care provider about these problems. It is especially important to talk to your health care provider if a new problem develops soon after your last Pap test. In these cases, your health care provider may recommend more frequent screening and Pap tests.   - The above recommendations are the same for women who have or have not gotten the vaccine for human papillomavirus (HPV).   - If you had a hysterectomy for a problem that was not cancer or a condition that could lead to cancer, then you no longer need Pap tests. Even if you no longer need a Pap test, a regular exam is a good idea to make sure no other problems are starting.   - If you are between ages 65 and 70 years, and you have had normal Pap tests going back 10 years, you no longer need Pap tests. Even if you no longer need a Pap test, a regular exam is a good idea to make sure no other problems are starting.   - If you have had past treatment for cervical cancer or a condition that could lead to cancer, you need Pap tests and screening for cancer for at least 20 years after your treatment.   - If Pap tests have been discontinued, risk factors (such as a new sexual partner) need to be reassessed to determine if screening should be resumed.   - The HPV test is an additional test that may be used for cervical cancer screening. The HPV test looks for the virus that can cause the cell changes on the cervix. The cells collected during the Pap test can be tested for HPV. The HPV test could be used to screen women aged 30  years and older, and should be used in women of any age who have unclear Pap test results. After the age of 30, women should have HPV testing at the same frequency as a Pap test.   Colorectal cancer can be detected and often prevented. Most routine colorectal cancer screening begins at the age of 50 years and continues through age 75 years. However, your health care provider may recommend screening at an earlier age if you have risk factors for colon cancer. On a yearly basis, your health care provider may provide home test kits to check for hidden blood in the stool.  Use of a small camera at the end of a tube, to directly examine the colon (sigmoidoscopy or colonoscopy), can detect the earliest forms of colorectal cancer. Talk to your health care provider about this at age 50, when routine screening begins. Direct exam of the colon should be repeated every 5 -10 years through age 75 years, unless early forms of pre-cancerous polyps or small growths are found.   People who are at an increased risk for hepatitis B should be screened for this virus. You are considered at high risk for hepatitis B if:  -You were born in a country where hepatitis B occurs often. Talk with your health care provider about which countries are considered high risk.  - Your parents were born in a high-risk country and you have not   received a shot to protect against hepatitis B (hepatitis B vaccine).  - You have HIV or AIDS.  - You use needles to inject street drugs.  - You live with, or have sex with, someone who has Hepatitis B.  - You get hemodialysis treatment.  - You take certain medicines for conditions like cancer, organ transplantation, and autoimmune conditions.   Hepatitis C blood testing is recommended for all people born from 1945 through 1965 and any individual with known risks for hepatitis C.   Practice safe sex. Use condoms and avoid high-risk sexual practices to reduce the spread of sexually transmitted  infections (STIs). STIs include gonorrhea, chlamydia, syphilis, trichomonas, herpes, HPV, and human immunodeficiency virus (HIV). Herpes, HIV, and HPV are viral illnesses that have no cure. They can result in disability, cancer, and death. Sexually active women aged 25 years and younger should be checked for chlamydia. Older women with new or multiple partners should also be tested for chlamydia. Testing for other STIs is recommended if you are sexually active and at increased risk.   Osteoporosis is a disease in which the bones lose minerals and strength with aging. This can result in serious bone fractures or breaks. The risk of osteoporosis can be identified using a bone density scan. Women ages 65 years and over and women at risk for fractures or osteoporosis should discuss screening with their health care providers. Ask your health care provider whether you should take a calcium supplement or vitamin D to There are also several preventive steps women can take to avoid osteoporosis and resulting fractures or to keep osteoporosis from worsening. -->Recommendations include:  Eat a balanced diet high in fruits, vegetables, calcium, and vitamins.  Get enough calcium. The recommended total intake of is 1,200 mg daily; for best absorption, if taking supplements, divide doses into 250-500 mg doses throughout the day. Of the two types of calcium, calcium carbonate is best absorbed when taken with food but calcium citrate can be taken on an empty stomach.  Get enough vitamin D. NAMS and the National Osteoporosis Foundation recommend at least 1,000 IU per day for women age 50 and over who are at risk of vitamin D deficiency. Vitamin D deficiency can be caused by inadequate sun exposure (for example, those who live in northern latitudes).  Avoid alcohol and smoking. Heavy alcohol intake (more than 7 drinks per week) increases the risk of falls and hip fracture and women smokers tend to lose bone more rapidly  and have lower bone mass than nonsmokers. Stopping smoking is one of the most important changes women can make to improve their health and decrease risk for disease.  Be physically active every day. Weight-bearing exercise (for example, fast walking, hiking, jogging, and weight training) may strengthen bones or slow the rate of bone loss that comes with aging. Balancing and muscle-strengthening exercises can reduce the risk of falling and fracture.  Consider therapeutic medications. Currently, several types of effective drugs are available. Healthcare providers can recommend the type most appropriate for each woman.  Eliminate environmental factors that may contribute to accidents. Falls cause nearly 90% of all osteoporotic fractures, so reducing this risk is an important bone-health strategy. Measures include ample lighting, removing obstructions to walking, using nonskid rugs on floors, and placing mats and/or grab bars in showers.  Be aware of medication side effects. Some common medicines make bones weaker. These include a type of steroid drug called glucocorticoids used for arthritis and asthma, some antiseizure drugs, certain sleeping   pills, treatments for endometriosis, and some cancer drugs. An overactive thyroid gland or using too much thyroid hormone for an underactive thyroid can also be a problem. If you are taking these medicines, talk to your doctor about what you can do to help protect your bones.reduce the rate of osteoporosis.    Menopause can be associated with physical symptoms and risks. Hormone replacement therapy is available to decrease symptoms and risks. You should talk to your health care provider about whether hormone replacement therapy is right for you.   Use sunscreen. Apply sunscreen liberally and repeatedly throughout the day. You should seek shade when your shadow is shorter than you. Protect yourself by wearing long sleeves, pants, a wide-brimmed hat, and sunglasses  year round, whenever you are outdoors.   Once a month, do a whole body skin exam, using a mirror to look at the skin on your back. Tell your health care provider of new moles, moles that have irregular borders, moles that are larger than a pencil eraser, or moles that have changed in shape or color.   -Stay current with required vaccines (immunizations).   Influenza vaccine. All adults should be immunized every year.  Tetanus, diphtheria, and acellular pertussis (Td, Tdap) vaccine. Pregnant women should receive 1 dose of Tdap vaccine during each pregnancy. The dose should be obtained regardless of the length of time since the last dose. Immunization is preferred during the 27th 36th week of gestation. An adult who has not previously received Tdap or who does not know her vaccine status should receive 1 dose of Tdap. This initial dose should be followed by tetanus and diphtheria toxoids (Td) booster doses every 10 years. Adults with an unknown or incomplete history of completing a 3-dose immunization series with Td-containing vaccines should begin or complete a primary immunization series including a Tdap dose. Adults should receive a Td booster every 10 years.  Varicella vaccine. An adult without evidence of immunity to varicella should receive 2 doses or a second dose if she has previously received 1 dose. Pregnant females who do not have evidence of immunity should receive the first dose after pregnancy. This first dose should be obtained before leaving the health care facility. The second dose should be obtained 4 8 weeks after the first dose.  Human papillomavirus (HPV) vaccine. Females aged 13 26 years who have not received the vaccine previously should obtain the 3-dose series. The vaccine is not recommended for use in pregnant females. However, pregnancy testing is not needed before receiving a dose. If a female is found to be pregnant after receiving a dose, no treatment is needed. In that  case, the remaining doses should be delayed until after the pregnancy. Immunization is recommended for any person with an immunocompromised condition through the age of 26 years if she did not get any or all doses earlier. During the 3-dose series, the second dose should be obtained 4 8 weeks after the first dose. The third dose should be obtained 24 weeks after the first dose and 16 weeks after the second dose.  Zoster vaccine. One dose is recommended for adults aged 60 years or older unless certain conditions are present.  Measles, mumps, and rubella (MMR) vaccine. Adults born before 1957 generally are considered immune to measles and mumps. Adults born in 1957 or later should have 1 or more doses of MMR vaccine unless there is a contraindication to the vaccine or there is laboratory evidence of immunity to each of the three diseases.   A routine second dose of MMR vaccine should be obtained at least 28 days after the first dose for students attending postsecondary schools, health care workers, or international travelers. People who received inactivated measles vaccine or an unknown type of measles vaccine during 1963 1967 should receive 2 doses of MMR vaccine. People who received inactivated mumps vaccine or an unknown type of mumps vaccine before 1979 and are at high risk for mumps infection should consider immunization with 2 doses of MMR vaccine. For females of childbearing age, rubella immunity should be determined. If there is no evidence of immunity, females who are not pregnant should be vaccinated. If there is no evidence of immunity, females who are pregnant should delay immunization until after pregnancy. Unvaccinated health care workers born before 1957 who lack laboratory evidence of measles, mumps, or rubella immunity or laboratory confirmation of disease should consider measles and mumps immunization with 2 doses of MMR vaccine or rubella immunization with 1 dose of MMR vaccine.  Pneumococcal  13-valent conjugate (PCV13) vaccine. When indicated, a person who is uncertain of her immunization history and has no record of immunization should receive the PCV13 vaccine. An adult aged 19 years or older who has certain medical conditions and has not been previously immunized should receive 1 dose of PCV13 vaccine. This PCV13 should be followed with a dose of pneumococcal polysaccharide (PPSV23) vaccine. The PPSV23 vaccine dose should be obtained at least 8 weeks after the dose of PCV13 vaccine. An adult aged 19 years or older who has certain medical conditions and previously received 1 or more doses of PPSV23 vaccine should receive 1 dose of PCV13. The PCV13 vaccine dose should be obtained 1 or more years after the last PPSV23 vaccine dose.  Pneumococcal polysaccharide (PPSV23) vaccine. When PCV13 is also indicated, PCV13 should be obtained first. All adults aged 65 years and older should be immunized. An adult younger than age 65 years who has certain medical conditions should be immunized. Any person who resides in a nursing home or long-term care facility should be immunized. An adult smoker should be immunized. People with an immunocompromised condition and certain other conditions should receive both PCV13 and PPSV23 vaccines. People with human immunodeficiency virus (HIV) infection should be immunized as soon as possible after diagnosis. Immunization during chemotherapy or radiation therapy should be avoided. Routine use of PPSV23 vaccine is not recommended for American Indians, [place] Natives, or people younger than 65 years unless there are medical conditions that require PPSV23 vaccine. When indicated, people who have unknown immunization and have no record of immunization should receive PPSV23 vaccine. One-time revaccination 5 years after the first dose of PPSV23 is recommended for people aged 19 64 years who have chronic kidney failure, nephrotic syndrome, asplenia, or immunocompromised conditions.  People who received 1 2 doses of PPSV23 before age 65 years should receive another dose of PPSV23 vaccine at age 65 years or later if at least 5 years have passed since the previous dose. Doses of PPSV23 are not needed for people immunized with PPSV23 at or after age 65 years.  Meningococcal vaccine. Adults with asplenia or persistent complement component deficiencies should receive 2 doses of quadrivalent meningococcal conjugate (MenACWY-D) vaccine. The doses should be obtained at least 2 months apart. Microbiologists working with certain meningococcal bacteria, military recruits, people at risk during an outbreak, and people who travel to or live in countries with a high rate of meningitis should be immunized. A first-year college student up through age 21 years   who is living in a residence hall should receive a dose if she did not receive a dose on or after her 16th birthday. Adults who have certain high-risk conditions should receive one or more doses of vaccine.  Hepatitis A vaccine. Adults who wish to be protected from this disease, have certain high-risk conditions, work with hepatitis A-infected animals, work in hepatitis A research labs, or travel to or work in countries with a high rate of hepatitis A should be immunized. Adults who were previously unvaccinated and who anticipate close contact with an international adoptee during the first 60 days after arrival in the [place] from a country with a high rate of hepatitis A should be immunized.  Hepatitis B vaccine.  Adults who wish to be protected from this disease, have certain high-risk conditions, may be exposed to blood or other infectious body fluids, are household contacts or sex partners of hepatitis B positive people, are clients or workers in certain care facilities, or travel to or work in countries with a high rate of hepatitis B should be immunized.  Haemophilus influenzae type b (Hib) vaccine. A previously unvaccinated person with  asplenia or sickle cell disease or having a scheduled splenectomy should receive 1 dose of Hib vaccine. Regardless of previous immunization, a recipient of a hematopoietic stem cell transplant should receive a 3-dose series 6 12 months after her successful transplant. Hib vaccine is not recommended for adults with HIV infection.  Preventive Services / Frequency Ages 19 to 39years  Blood pressure check.** / Every 1 to 2 years.  Lipid and cholesterol check.** / Every 5 years beginning at age 20.  Clinical breast exam.** / Every 3 years for women in their 20s and 30s.  BRCA-related cancer risk assessment.** / For women who have family members with a BRCA-related cancer (breast, ovarian, tubal, or peritoneal cancers).  Pap test.** / Every 2 years from ages 21 through 29. Every 3 years starting at age 30 through age 65 or 70 with a history of 3 consecutive normal Pap tests.  HPV screening.** / Every 3 years from ages 30 through ages 65 to 70 with a history of 3 consecutive normal Pap tests.  Hepatitis C blood test.** / For any individual with known risks for hepatitis C.  Skin self-exam. / Monthly.  Influenza vaccine. / Every year.  Tetanus, diphtheria, and acellular pertussis (Tdap, Td) vaccine.** / Consult your health care provider. Pregnant women should receive 1 dose of Tdap vaccine during each pregnancy. 1 dose of Td every 10 years.  Varicella vaccine.** / Consult your health care provider. Pregnant females who do not have evidence of immunity should receive the first dose after pregnancy.  HPV vaccine. / 3 doses over 6 months, if 26 and younger. The vaccine is not recommended for use in pregnant females. However, pregnancy testing is not needed before receiving a dose.  Measles, mumps, rubella (MMR) vaccine.** / You need at least 1 dose of MMR if you were born in 1957 or later. You may also need a 2nd dose. For females of childbearing age, rubella immunity should be determined. If there  is no evidence of immunity, females who are not pregnant should be vaccinated. If there is no evidence of immunity, females who are pregnant should delay immunization until after pregnancy.  Pneumococcal 13-valent conjugate (PCV13) vaccine.** / Consult your health care provider.  Pneumococcal polysaccharide (PPSV23) vaccine.** / 1 to 2 doses if you smoke cigarettes or if you have certain conditions.  Meningococcal   vaccine.** / 1 dose if you are age 19 to 21 years and a first-year college student living in a residence hall, or have one of several medical conditions, you need to get vaccinated against meningococcal disease. You may also need additional booster doses.  Hepatitis A vaccine.** / Consult your health care provider.  Hepatitis B vaccine.** / Consult your health care provider.  Haemophilus influenzae type b (Hib) vaccine.** / Consult your health care provider.  Ages 40 to 64years  Blood pressure check.** / Every 1 to 2 years.  Lipid and cholesterol check.** / Every 5 years beginning at age 20 years.  Lung cancer screening. / Every year if you are aged 55 80 years and have a 30-pack-year history of smoking and currently smoke or have quit within the past 15 years. Yearly screening is stopped once you have quit smoking for at least 15 years or develop a health problem that would prevent you from having lung cancer treatment.  Clinical breast exam.** / Every year after age 40 years.  BRCA-related cancer risk assessment.** / For women who have family members with a BRCA-related cancer (breast, ovarian, tubal, or peritoneal cancers).  Mammogram.** / Every year beginning at age 40 years and continuing for as long as you are in good health. Consult with your health care provider.  Pap test.** / Every 3 years starting at age 30 years through age 65 or 70 years with a history of 3 consecutive normal Pap tests.  HPV screening.** / Every 3 years from ages 30 years through ages 65 to 70  years with a history of 3 consecutive normal Pap tests.  Fecal occult blood test (FOBT) of stool. / Every year beginning at age 50 years and continuing until age 75 years. You may not need to do this test if you get a colonoscopy every 10 years.  Flexible sigmoidoscopy or colonoscopy.** / Every 5 years for a flexible sigmoidoscopy or every 10 years for a colonoscopy beginning at age 50 years and continuing until age 75 years.  Hepatitis C blood test.** / For all people born from 1945 through 1965 and any individual with known risks for hepatitis C.  Skin self-exam. / Monthly.  Influenza vaccine. / Every year.  Tetanus, diphtheria, and acellular pertussis (Tdap/Td) vaccine.** / Consult your health care provider. Pregnant women should receive 1 dose of Tdap vaccine during each pregnancy. 1 dose of Td every 10 years.  Varicella vaccine.** / Consult your health care provider. Pregnant females who do not have evidence of immunity should receive the first dose after pregnancy.  Zoster vaccine.** / 1 dose for adults aged 60 years or older.  Measles, mumps, rubella (MMR) vaccine.** / You need at least 1 dose of MMR if you were born in 1957 or later. You may also need a 2nd dose. For females of childbearing age, rubella immunity should be determined. If there is no evidence of immunity, females who are not pregnant should be vaccinated. If there is no evidence of immunity, females who are pregnant should delay immunization until after pregnancy.  Pneumococcal 13-valent conjugate (PCV13) vaccine.** / Consult your health care provider.  Pneumococcal polysaccharide (PPSV23) vaccine.** / 1 to 2 doses if you smoke cigarettes or if you have certain conditions.  Meningococcal vaccine.** / Consult your health care provider.  Hepatitis A vaccine.** / Consult your health care provider.  Hepatitis B vaccine.** / Consult your health care provider.  Haemophilus influenzae type b (Hib) vaccine.** / Consult  your health care   provider.  Ages 65 years and over  Blood pressure check.** / Every 1 to 2 years.  Lipid and cholesterol check.** / Every 5 years beginning at age 20 years.  Lung cancer screening. / Every year if you are aged 55 80 years and have a 30-pack-year history of smoking and currently smoke or have quit within the past 15 years. Yearly screening is stopped once you have quit smoking for at least 15 years or develop a health problem that would prevent you from having lung cancer treatment.  Clinical breast exam.** / Every year after age 40 years.  BRCA-related cancer risk assessment.** / For women who have family members with a BRCA-related cancer (breast, ovarian, tubal, or peritoneal cancers).  Mammogram.** / Every year beginning at age 40 years and continuing for as long as you are in good health. Consult with your health care provider.  Pap test.** / Every 3 years starting at age 30 years through age 65 or 70 years with 3 consecutive normal Pap tests. Testing can be stopped between 65 and 70 years with 3 consecutive normal Pap tests and no abnormal Pap or HPV tests in the past 10 years.  HPV screening.** / Every 3 years from ages 30 years through ages 65 or 70 years with a history of 3 consecutive normal Pap tests. Testing can be stopped between 65 and 70 years with 3 consecutive normal Pap tests and no abnormal Pap or HPV tests in the past 10 years.  Fecal occult blood test (FOBT) of stool. / Every year beginning at age 50 years and continuing until age 75 years. You may not need to do this test if you get a colonoscopy every 10 years.  Flexible sigmoidoscopy or colonoscopy.** / Every 5 years for a flexible sigmoidoscopy or every 10 years for a colonoscopy beginning at age 50 years and continuing until age 75 years.  Hepatitis C blood test.** / For all people born from 1945 through 1965 and any individual with known risks for hepatitis C.  Osteoporosis screening.** / A  one-time screening for women ages 65 years and over and women at risk for fractures or osteoporosis.  Skin self-exam. / Monthly.  Influenza vaccine. / Every year.  Tetanus, diphtheria, and acellular pertussis (Tdap/Td) vaccine.** / 1 dose of Td every 10 years.  Varicella vaccine.** / Consult your health care provider.  Zoster vaccine.** / 1 dose for adults aged 60 years or older.  Pneumococcal 13-valent conjugate (PCV13) vaccine.** / Consult your health care provider.  Pneumococcal polysaccharide (PPSV23) vaccine.** / 1 dose for all adults aged 65 years and older.  Meningococcal vaccine.** / Consult your health care provider.  Hepatitis A vaccine.** / Consult your health care provider.  Hepatitis B vaccine.** / Consult your health care provider.  Haemophilus influenzae type b (Hib) vaccine.** / Consult your health care provider. ** Family history and personal history of risk and conditions may change your health care provider's recommendations. Document Released: 07/04/2001 Document Revised: 02/26/2013  [employer] Patient Information 2014 [employer]   EXERCISE AND DIET:  We recommended that you start or continue a regular exercise program for good health. Regular exercise means any activity that makes your heart beat faster and makes you sweat.  We recommend exercising at least 30 minutes per day at least 3 days a week, preferably 5.  We also recommend a diet low in fat and sugar / carbohydrates.  Inactivity, poor dietary choices and obesity can cause diabetes, heart attack, stroke, and kidney   damage, among others.     ALCOHOL AND SMOKING:  Women should limit their alcohol intake to no more than 7 drinks/beers/glasses of wine (combined, not each!) per week. Moderation of alcohol intake to this level decreases your risk of breast cancer and liver damage.  ( And of course, no recreational drugs are part of a healthy lifestyle.)  Also, you should not be smoking at all or even being  exposed to second hand smoke. Most people know smoking can cause cancer, and various heart and lung diseases, but did you know it also contributes to weakening of your bones?  Aging of your skin?  Yellowing of your teeth and nails?   CALCIUM AND VITAMIN D:  Adequate intake of calcium and Vitamin D are recommended.  The recommendations for exact amounts of these supplements seem to change often, but generally speaking 600 mg of calcium (either carbonate or citrate) and 800 units of Vitamin D per day seems prudent. Certain women may benefit from higher intake of Vitamin D.  If you are among these women, your doctor will have told you during your visit.     PAP SMEARS:  Pap smears, to check for cervical cancer or precancers,  have traditionally been done yearly, although recent scientific advances have shown that most women can have pap smears less often.  However, every woman still should have a physical exam from her gynecologist or primary care physician every year. It will include a breast check, inspection of the vulva and vagina to check for abnormal growths or skin changes, a visual exam of the cervix, and then an exam to evaluate the size and shape of the uterus and ovaries.  And after 54 years of age, a rectal exam is indicated to check for rectal cancers. We will also provide age appropriate advice regarding health maintenance, like when you should have certain vaccines, screening for sexually transmitted diseases, bone density testing, colonoscopy, mammograms, etc.    MAMMOGRAMS:  All women over 40 years old should have a yearly mammogram. Many facilities now offer a "3D" mammogram, which may cost around $50 extra out of pocket. If possible,  we recommend you accept the option to have the 3D mammogram performed.  It both reduces the number of women who will be called back for extra views which then turn out to be normal, and it is better than the routine mammogram at detecting truly abnormal  areas.     COLONOSCOPY:  Colonoscopy to screen for colon cancer is recommended for all women at age 50.  We know, you hate the idea of the prep.  We agree, BUT, having colon cancer and not knowing it is worse!!  Colon cancer so often starts as a polyp that can be seen and removed at colonscopy, which can quite literally save your life!  And if your first colonoscopy is normal and you have no family history of colon cancer, most women don't have to have it again for 10 years.  Once every ten years, you can do something that may end up saving your life, right?  We will be happy to help you get it scheduled when you are ready.  Be sure to check your insurance coverage so you understand how much it will cost.  It may be covered as a preventative service at no cost, but you should check your particular policy.   

## 2019-08-13 LAB — HEPATITIS C ANTIBODY: Hep C Virus Ab: <0.1 s/co ratio (ref 0.0–0.9)

## 2019-08-13 LAB — CBC
Hematocrit: 36.7 % (ref 34.0–46.6)
Hemoglobin: 12.8 g/dL (ref 11.1–15.9)
MCH: 29.4 pg (ref 26.6–33.0)
MCHC: 34.9 g/dL (ref 31.5–35.7)
MCV: 84 fL (ref 79–97)
Platelets: 286 10*3/uL (ref 150–450)
RBC: 4.35 x10E6/uL (ref 3.77–5.28)
RDW: 12.2 % (ref 11.7–15.4)
WBC: 6.4 10*3/uL (ref 3.4–10.8)

## 2019-08-13 LAB — COMPREHENSIVE METABOLIC PANEL
ALT: 15 IU/L (ref 0–32)
AST: 16 IU/L (ref 0–40)
Albumin/Globulin Ratio: 2 (ref 1.2–2.2)
Albumin: 4.6 g/dL (ref 3.8–4.9)
Alkaline Phosphatase: 49 IU/L (ref 39–117)
BUN/Creatinine Ratio: 17 (ref 9–23)
BUN: 13 mg/dL (ref 6–24)
Bilirubin Total: 0.4 mg/dL (ref 0.0–1.2)
CO2: 22 mmol/L (ref 20–29)
Calcium: 9.8 mg/dL (ref 8.7–10.2)
Chloride: 104 mmol/L (ref 96–106)
Creatinine, Ser: 0.78 mg/dL (ref 0.57–1.00)
GFR calc Af Amer: 100 mL/min/{1.73_m2} (ref >59)
GFR calc non Af Amer: 87 mL/min/{1.73_m2} (ref >59)
Globulin, Total: 2.3 g/dL (ref 1.5–4.5)
Glucose: 89 mg/dL (ref 65–99)
Potassium: 4.3 mmol/L (ref 3.5–5.2)
Sodium: 142 mmol/L (ref 134–144)
Total Protein: 6.9 g/dL (ref 6.0–8.5)

## 2019-08-13 LAB — HEMOGLOBIN A1C
Est. average glucose Bld gHb Est-mCnc: 105 mg/dL
Hgb A1c MFr Bld: 5.3 % (ref 4.8–5.6)

## 2019-08-13 LAB — TSH: TSH: 0.594 u[IU]/mL (ref 0.450–4.500)

## 2019-08-13 LAB — LIPID PANEL
Chol/HDL Ratio: 2.2 ratio (ref 0.0–4.4)
Cholesterol, Total: 166 mg/dL (ref 100–199)
HDL: 75 mg/dL (ref >39)
LDL Chol Calc (NIH): 78 mg/dL (ref 0–99)
Triglycerides: 64 mg/dL (ref 0–149)
VLDL Cholesterol Cal: 13 mg/dL (ref 5–40)

## 2019-08-13 LAB — VITAMIN D 25 HYDROXY (VIT D DEFICIENCY, FRACTURES): Vit D, 25-Hydroxy: 27.4 ng/mL — ABNORMAL LOW (ref 30.0–100.0)

## 2019-08-13 LAB — T4, FREE: Free T4: 1.12 ng/dL (ref 0.82–1.77)

## 2019-08-20 LAB — HM MAMMOGRAPHY

## 2019-10-01 ENCOUNTER — Ambulatory Visit: Payer: Self-pay | Attending: Internal Medicine

## 2019-10-01 DIAGNOSIS — Z20822 Contact with and (suspected) exposure to covid-19: Secondary | ICD-10-CM | POA: Insufficient documentation

## 2019-10-02 LAB — NOVEL CORONAVIRUS, NAA: SARS-CoV-2, NAA: NOT DETECTED

## 2019-10-02 LAB — SARS-COV-2, NAA 2 DAY TAT

## 2019-11-18 NOTE — Progress Notes (Signed)
Opened in error. T. Gleen Ripberger, CMA 

## 2023-03-01 ENCOUNTER — Other Ambulatory Visit: Payer: Self-pay

## 2023-03-01 ENCOUNTER — Emergency Department (HOSPITAL_COMMUNITY): Payer: No Typology Code available for payment source

## 2023-03-01 ENCOUNTER — Emergency Department (HOSPITAL_COMMUNITY)
Admission: EM | Admit: 2023-03-01 | Discharge: 2023-03-02 | Disposition: A | Discharge status: Home / Self Care | Payer: No Typology Code available for payment source | Attending: Emergency Medicine | Admitting: Emergency Medicine

## 2023-03-01 DIAGNOSIS — H5702 Anisocoria: Secondary | ICD-10-CM | POA: Diagnosis not present

## 2023-03-01 DIAGNOSIS — Z79899 Other long term (current) drug therapy: Secondary | ICD-10-CM | POA: Diagnosis not present

## 2023-03-01 DIAGNOSIS — R519 Headache, unspecified: Secondary | ICD-10-CM | POA: Insufficient documentation

## 2023-03-01 DIAGNOSIS — H57053 Tonic pupil, bilateral: Secondary | ICD-10-CM

## 2023-03-01 DIAGNOSIS — I10 Essential (primary) hypertension: Secondary | ICD-10-CM | POA: Diagnosis not present

## 2023-03-01 LAB — CBC WITH DIFFERENTIAL/PLATELET
Abs Immature Granulocytes: 0.01 10*3/uL (ref 0.00–0.07)
Basophils Absolute: 0 10*3/uL (ref 0.0–0.1)
Basophils Relative: 1 %
Eosinophils Absolute: 0.4 10*3/uL (ref 0.0–0.5)
Eosinophils Relative: 8 %
HCT: 35 % — ABNORMAL LOW (ref 36.0–46.0)
Hemoglobin: 11.5 g/dL — ABNORMAL LOW (ref 12.0–15.0)
Immature Granulocytes: 0 %
Lymphocytes Relative: 42 %
Lymphs Abs: 2 10*3/uL (ref 0.7–4.0)
MCH: 29.3 pg (ref 26.0–34.0)
MCHC: 32.9 g/dL (ref 30.0–36.0)
MCV: 89.1 fL (ref 80.0–100.0)
Monocytes Absolute: 0.4 10*3/uL (ref 0.1–1.0)
Monocytes Relative: 9 %
Neutro Abs: 1.9 10*3/uL (ref 1.7–7.7)
Neutrophils Relative %: 40 %
Platelets: 248 10*3/uL (ref 150–400)
RBC: 3.93 MIL/uL (ref 3.87–5.11)
RDW: 12.2 % (ref 11.5–15.5)
WBC: 4.7 10*3/uL (ref 4.0–10.5)
nRBC: 0 % (ref 0.0–0.2)

## 2023-03-01 LAB — COMPREHENSIVE METABOLIC PANEL
ALT: 53 U/L — ABNORMAL HIGH (ref 0–44)
AST: 21 U/L (ref 15–41)
Albumin: 3.6 g/dL (ref 3.5–5.0)
Alkaline Phosphatase: 57 U/L (ref 38–126)
Anion gap: 9 (ref 5–15)
BUN: 18 mg/dL (ref 6–20)
CO2: 27 mmol/L (ref 22–32)
Calcium: 9.4 mg/dL (ref 8.9–10.3)
Chloride: 105 mmol/L (ref 98–111)
Creatinine, Ser: 0.85 mg/dL (ref 0.44–1.00)
GFR, Estimated: >60 mL/min (ref >60)
Glucose, Bld: 114 mg/dL — ABNORMAL HIGH (ref 70–99)
Potassium: 3.5 mmol/L (ref 3.5–5.1)
Sodium: 141 mmol/L (ref 135–145)
Total Bilirubin: 0.4 mg/dL (ref 0.3–1.2)
Total Protein: 6.1 g/dL — ABNORMAL LOW (ref 6.5–8.1)

## 2023-03-01 LAB — ETHANOL: Alcohol, Ethyl (B): <10 mg/dL (ref <10)

## 2023-03-01 MED ORDER — GADOBUTROL 1 MMOL/ML IV SOLN
7.0000 mL | Freq: Once | INTRAVENOUS | Status: AC | PRN
  Administered 2023-03-01: 7 mL via INTRAVENOUS

## 2023-03-01 MED ORDER — IOHEXOL 350 MG/ML SOLN
75.0000 mL | Freq: Once | INTRAVENOUS | Status: AC | PRN
  Administered 2023-03-01: 75 mL via INTRAVENOUS

## 2023-03-01 NOTE — Discharge Instructions (Signed)
Follow-up with your ophthalmologist.  Your workup here was reassuring.

## 2023-03-01 NOTE — ED Triage Notes (Signed)
PT BIB [facility] from home for blurred vision and unequal pupils, noticed by coworkers around 9:30am. PT was going to wait to visit eye doctor tomorrow until she started having a nosebleed after her shower this evening. PT then decided to come in via EMS. PT endorses headache, and blurred vision.   EMS Vitals BP 118/84 HR 78 RR 18 CBG 93 SpO2 100 Room Air.

## 2023-03-01 NOTE — ED Provider Notes (Signed)
[place] EMERGENCY DEPARTMENT AT [facility] Provider Note   CSN: [id number] Arrival date & time: 03/01/23  1857     History  Chief Complaint  Patient presents with   Eye Problem   Headache    Deanna Barrett is a 57 y.o. female who presents emergency department with a chief complaint of anisocoria.  She has a past medical history of Adie's pupil bilaterally states that her eyes are usually a bit dilated that are equal.  Patient wears contact lenses when she woke up this morning noted that her right eye was blurry.  She put a contact in and changed it because it was not improving.  She states that it did not really improve and at work her coworkers were commenting that her pupils were unequal.  She did not think much of it.  She then developed a headache on the right side which is unusual for her and she states it was mild and went away with the Tylenol.  She does not usually get headaches.  Patient states that she called her eye doctor and then called another eye doctor and both of them suggested she come to the emergency department.  Patient states that she did not heed their warning until she developed a nosebleed which resolved.  Patient states she takes telmisartan for her blood pressure.  She does not take any anticholinergic medications.  She denies any other neurologic deficits.  She denies eye pain   Eye Problem Associated symptoms: headaches   Headache      Home Medications Prior to Admission medications   Medication Sig Start Date End Date Taking? Authorizing Provider  gabapentin (NEURONTIN) 300 MG capsule Take 900 mg by mouth at bedtime.    [provider]  HYDROcodone-acetaminophen (NORCO/VICODIN) 5-325 MG tablet hydrocodone 5 mg-acetaminophen 325 mg tablet  TAKE 1 TABLET BY MOUTH EVERY 6 HOURS PRN FOR 5 DAYS    [provider]      Allergies    Patient has no known allergies.    Review of Systems   Review of Systems  Neurological:   Positive for headaches.    Physical Exam Updated Vital Signs BP 119/75 (BP Location: Left Arm)   Pulse 72   Temp 98.2 F (36.8 C) (Oral)   Resp 16   Ht 5\' 6"  (1.676 m)   Wt 72 kg   BMI 25.62 kg/m  Physical Exam Vitals and nursing note reviewed.  Constitutional:      General: She is not in acute distress.    Appearance: She is well-developed. She is not diaphoretic.  HENT:     Head: Normocephalic and atraumatic.     Right Ear: External ear normal.     Left Ear: External ear normal.     Nose: Nose normal. No nasal deformity.     Right Nostril: No epistaxis.     Mouth/Throat:     Mouth: Mucous membranes are moist.  Eyes:     General: No visual field deficit or scleral icterus.    Conjunctiva/sclera: Conjunctivae normal.     Comments: The right eye has a pupil that is much larger than the left.  It is minimally reactive to both direct and consensual light.  The left eye pupil is smaller and more reactive   Cardiovascular:     Rate and Rhythm: Normal rate and regular rhythm.     Heart sounds: Normal heart sounds. No murmur heard.    No friction rub. No gallop.  Pulmonary:     Effort: Pulmonary effort is normal. No respiratory distress.     Breath sounds: Normal breath sounds.  Abdominal:     General: Bowel sounds are normal. There is no distension.     Palpations: Abdomen is soft. There is no mass.     Tenderness: There is no abdominal tenderness. There is no guarding.  Musculoskeletal:     Cervical back: Normal range of motion.  Skin:    General: Skin is warm and dry.  Neurological:     Mental Status: She is alert and oriented to person, place, and time.     GCS: GCS eye subscore is 4. GCS verbal subscore is 5. GCS motor subscore is 6.     Cranial Nerves: No dysarthria or facial asymmetry.     Sensory: No sensory deficit.     Motor: No weakness.     Gait: Gait normal.     Deep Tendon Reflexes: Reflexes normal.  Psychiatric:        Mood and Affect: Mood normal. Mood  is not anxious.        Behavior: Behavior normal.     ED Results / Procedures / Treatments   Labs (all labs ordered are listed, but only abnormal results are displayed) Labs Reviewed  CBC WITH DIFFERENTIAL/PLATELET - Abnormal; Notable for the following components:      Result Value   Hemoglobin 11.5 (*)    HCT 35.0 (*)    All other components within normal limits  COMPREHENSIVE METABOLIC PANEL  ETHANOL  CBC  DIFFERENTIAL  RAPID URINE DRUG SCREEN, HOSP PERFORMED  URINALYSIS, ROUTINE W REFLEX MICROSCOPIC    EKG None  Radiology No results found.  Procedures Procedures    Medications Ordered in ED Medications - No data to display  ED Course/ Medical Decision Making/ A&P                                 Medical Decision Making Amount and/or Complexity of Data Reviewed Labs: ordered. Radiology: ordered.  Risk Prescription drug management.    Patient with headache, anisocoria.  No other visual changes.  Case discussed with Dr. Iver Nestle, who recommends workup as per her note.  I have given signout to Dr.pickering at shift change .       Final Clinical Impression(s) / ED Diagnoses Final diagnoses:  None    Rx / DC Orders ED Discharge Orders     None         Arthor Captain, PA-C 03/06/23 1005    Benjiman Core, MD 03/06/23 1504

## 2023-03-01 NOTE — Plan of Care (Signed)
  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  However today she has worsening dilation of one of her eyes, she also had a mild headache.  Was told to go to ED for evaluation of this but was preferring outpatient follow-up until she developed a nosebleed  She has no other symptoms and her exam is otherwise normal for ED PA other than the anisocoria and both pupils are poorly reactive  I am asked for imaging recommendations  CTA head and neck if renal function is normal to rule out aneurysm If renal function does not allow please obtain MRA head MRI brain with and without contrast to evaluate for any infectious/inflammatory etiologies  If these studies are reassuring, further workup/management can be completed on an outpatient basis  If other questions and concerns arise please do not hesitate to reach out to neurology, we are happy to engage with full consultation if needed  Brooke Dare MD-PhD [facility] [phone]

## 2023-03-02 LAB — RAPID URINE DRUG SCREEN, HOSP PERFORMED
Amphetamines: NOT DETECTED
Barbiturates: NOT DETECTED
Benzodiazepines: NOT DETECTED
Cocaine: NOT DETECTED
Opiates: NOT DETECTED
Tetrahydrocannabinol: POSITIVE — AB

## 2023-03-02 LAB — URINALYSIS, ROUTINE W REFLEX MICROSCOPIC
Bilirubin Urine: NEGATIVE
Glucose, UA: NEGATIVE mg/dL
Hgb urine dipstick: NEGATIVE
Ketones, ur: NEGATIVE mg/dL
Leukocytes,Ua: NEGATIVE
Nitrite: NEGATIVE
Protein, ur: NEGATIVE mg/dL
Specific Gravity, Urine: 1.018 (ref 1.005–1.030)
pH: 5 (ref 5.0–8.0)

## 2023-03-02 NOTE — ED Notes (Signed)
VISUAL ACUITY TEST PATIENT IS ABLE TO SEE 20/25 ON RIGHT EYE AND 20/25 IN LEFT EYE

## 2023-08-09 ENCOUNTER — Other Ambulatory Visit: Payer: Self-pay | Admitting: Obstetrics and Gynecology

## 2023-08-09 DIAGNOSIS — R928 Other abnormal and inconclusive findings on diagnostic imaging of breast: Secondary | ICD-10-CM

## 2024-02-20 ENCOUNTER — Other Ambulatory Visit: Payer: Self-pay | Admitting: Medical Genetics

## 2024-06-16 ENCOUNTER — Other Ambulatory Visit: Payer: Self-pay | Admitting: Medical Genetics

## 2024-06-16 DIAGNOSIS — Z006 Encounter for examination for normal comparison and control in clinical research program: Secondary | ICD-10-CM
